# Patient Record
Sex: Male | Born: 1980 | Race: Black or African American | Hispanic: No | Marital: Married | Smoking: Former smoker
Health system: Southern US, Community
[De-identification: ages and names within clinical notes are randomized; demographics above are authoritative.]

## PROBLEM LIST (undated history)

## (undated) DIAGNOSIS — I10 Essential (primary) hypertension: Secondary | ICD-10-CM

## (undated) DIAGNOSIS — E119 Type 2 diabetes mellitus without complications: Secondary | ICD-10-CM

## (undated) DIAGNOSIS — R222 Localized swelling, mass and lump, trunk: Secondary | ICD-10-CM

## (undated) DIAGNOSIS — G4733 Obstructive sleep apnea (adult) (pediatric): Secondary | ICD-10-CM

## (undated) HISTORY — PX: GANGLION CYST EXCISION: SHX1691

## (undated) HISTORY — DX: Obstructive sleep apnea (adult) (pediatric): G47.33

---
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
## 2003-08-24 ENCOUNTER — Emergency Department (HOSPITAL_COMMUNITY): Admission: EM | Admit: 2003-08-24 | Discharge: 2003-08-24 | Payer: Self-pay | Admitting: Emergency Medicine

## 2005-07-14 ENCOUNTER — Emergency Department (HOSPITAL_COMMUNITY): Admission: EM | Admit: 2005-07-14 | Discharge: 2005-07-14 | Payer: Self-pay | Admitting: *Deleted

## 2005-07-15 ENCOUNTER — Emergency Department (HOSPITAL_COMMUNITY): Admission: EM | Admit: 2005-07-15 | Discharge: 2005-07-15 | Payer: Self-pay | Admitting: Emergency Medicine

## 2005-07-25 ENCOUNTER — Emergency Department (HOSPITAL_COMMUNITY): Admission: EM | Admit: 2005-07-25 | Discharge: 2005-07-25 | Payer: Self-pay | Admitting: Emergency Medicine

## 2007-12-23 ENCOUNTER — Emergency Department (HOSPITAL_COMMUNITY): Admission: EM | Admit: 2007-12-23 | Discharge: 2007-12-23 | Payer: Self-pay | Admitting: Family Medicine

## 2007-12-28 ENCOUNTER — Emergency Department (HOSPITAL_COMMUNITY): Admission: EM | Admit: 2007-12-28 | Discharge: 2007-12-28 | Payer: Self-pay | Admitting: Emergency Medicine

## 2011-04-06 LAB — URINALYSIS, ROUTINE W REFLEX MICROSCOPIC
Glucose, UA: NEGATIVE
Hgb urine dipstick: NEGATIVE
Ketones, ur: 40 — AB
Nitrite: POSITIVE — AB
Protein, ur: 30 — AB
Specific Gravity, Urine: 1.035 — ABNORMAL HIGH
Urobilinogen, UA: >8 — ABNORMAL HIGH
pH: 5

## 2011-04-06 LAB — URINE CULTURE: Colony Count: NO GROWTH

## 2011-04-06 LAB — POCT RAPID STREP A: Streptococcus, Group A Screen (Direct): NEGATIVE

## 2011-04-06 LAB — URINE MICROSCOPIC-ADD ON

## 2011-05-30 ENCOUNTER — Encounter: Payer: Self-pay | Admitting: Emergency Medicine

## 2011-05-30 ENCOUNTER — Emergency Department (HOSPITAL_COMMUNITY)
Admission: EM | Admit: 2011-05-30 | Discharge: 2011-05-31 | Disposition: A | Discharge status: Home / Self Care | Payer: Self-pay | Attending: Emergency Medicine | Admitting: Emergency Medicine

## 2011-05-30 ENCOUNTER — Emergency Department (HOSPITAL_COMMUNITY): Payer: Self-pay

## 2011-05-30 DIAGNOSIS — F17200 Nicotine dependence, unspecified, uncomplicated: Secondary | ICD-10-CM | POA: Insufficient documentation

## 2011-05-30 DIAGNOSIS — J189 Pneumonia, unspecified organism: Secondary | ICD-10-CM | POA: Insufficient documentation

## 2011-05-30 DIAGNOSIS — R51 Headache: Secondary | ICD-10-CM | POA: Insufficient documentation

## 2011-05-30 DIAGNOSIS — R079 Chest pain, unspecified: Secondary | ICD-10-CM | POA: Insufficient documentation

## 2011-05-30 MED ORDER — IPRATROPIUM BROMIDE 0.02 % IN SOLN
0.5000 mg | Freq: Once | RESPIRATORY_TRACT | Status: AC
  Administered 2011-05-30: 0.5 mg via RESPIRATORY_TRACT
  Filled 2011-05-30: qty 2.5

## 2011-05-30 MED ORDER — ALBUTEROL SULFATE (5 MG/ML) 0.5% IN NEBU
5.0000 mg | INHALATION_SOLUTION | Freq: Once | RESPIRATORY_TRACT | Status: AC
  Administered 2011-05-30: 5 mg via RESPIRATORY_TRACT
  Filled 2011-05-30: qty 1

## 2011-05-30 MED ORDER — AZITHROMYCIN 250 MG PO TABS: 250.0000 mg | ORAL_TABLET | Freq: Every day | ORAL | Status: AC

## 2011-05-30 MED ORDER — ALBUTEROL SULFATE HFA 108 (90 BASE) MCG/ACT IN AERS: 1.0000 | INHALATION_SPRAY | Freq: Four times a day (QID) | RESPIRATORY_TRACT | Status: DC | PRN

## 2011-05-30 MED ORDER — AZITHROMYCIN 250 MG PO TABS
500.0000 mg | ORAL_TABLET | Freq: Once | ORAL | Status: AC
  Administered 2011-05-31: 500 mg via ORAL
  Filled 2011-05-30: qty 2

## 2011-05-30 NOTE — ED Provider Notes (Signed)
History     CSN: [id number] Arrival date & time: 05/30/2011  8:44 PM   First MD Initiated Contact with Patient 05/30/11 2310      Chief Complaint  Patient presents with  . Cough  . Headache    (Consider location/radiation/quality/duration/timing/severity/associated sxs/prior treatment) Patient is a 30 y.o. male presenting with cough and headaches. The history is provided by the patient.  Cough This is a new problem. The current episode started more than 1 week ago. The problem has not changed since onset.There has been no fever. Associated symptoms include chest pain, headaches and wheezing. Pertinent negatives include no rhinorrhea, no sore throat and no shortness of breath. He has tried nothing for the symptoms.  Headache  Pertinent negatives include no shortness of breath.   Pt has been having cough and headache since last week.  Bringing up mucus.  Some dry heaves with coughing spells.  Pt is also having some pain in his chest with the cough. History reviewed. No pertinent past medical history.  History reviewed. No pertinent past surgical history.  History reviewed. No pertinent family history.  History  Substance Use Topics  . Smoking status: Current Everyday Smoker -- 0.5 packs/day  . Smokeless tobacco: Not on file  . Alcohol Use: Yes      Review of Systems  HENT: Negative for sore throat and rhinorrhea.   Respiratory: Positive for cough and wheezing. Negative for shortness of breath.   Cardiovascular: Positive for chest pain.  Neurological: Positive for headaches.  All other systems reviewed and are negative.    Allergies  Review of patient's allergies indicates no known allergies.  Home Medications  No current outpatient prescriptions on file.  BP 139/71  Pulse 116  Temp(Src) 99.7 F (37.6 C) (Oral)  Resp 20  SpO2 94%  Physical Exam  Nursing note and vitals reviewed. Constitutional: He appears well-developed and well-nourished. No distress.    HENT:  Head: Normocephalic and atraumatic.  Right Ear: External ear normal.  Left Ear: External ear normal.  Eyes: Conjunctivae are normal. Right eye exhibits no discharge. Left eye exhibits no discharge. No scleral icterus.  Neck: Neck supple. No tracheal deviation present.  Cardiovascular: Normal rate, regular rhythm and intact distal pulses.   Pulmonary/Chest: Effort normal. No accessory muscle usage or stridor. Not tachypneic. No respiratory distress. He has no wheezes. He has rales in the right upper field and the right lower field.  Abdominal: Soft. Bowel sounds are normal. He exhibits no distension. There is no tenderness. There is no rebound and no guarding.  Musculoskeletal: He exhibits no edema and no tenderness.  Neurological: He is alert. He has normal strength. No sensory deficit. Cranial nerve deficit:  no gross defecits noted. He exhibits normal muscle tone. He displays no seizure activity. Coordination normal.  Skin: Skin is warm and dry. No rash noted.  Psychiatric: He has a normal mood and affect.    ED Course  Procedures (including critical care time)  Labs Reviewed - No data to display Dg Chest 2 View  05/30/2011  *RADIOLOGY REPORT*  Clinical Data: Chest pain.  Cough.  Shortness of breath.  Rales on the right at physical examination.  CHEST - 2 VIEW 05/30/2011:  Comparison: None.  Findings: Cardiomediastinal silhouette unremarkable.  Patchy airspace opacities in the medial right lower lobe.  Lungs otherwise clear.  No pleural effusions.  Visualized bony thorax intact.  IMPRESSION: Right lower lobe bronchopneumonia.  Original Report Authenticated By: Arnell Sieving, M.D.  Diagnosis: Community-acquired pneumonia   MDM  Patient appears to have a right lower lobe pneumonia. He is otherwise healthy. He has normal vital signs except for mild tachycardia.  Patient has been taking oral fluids well. He will be a good candidate for outpatient  treatment.        Celene Kras, MD 05/30/11 [phone]

## 2011-05-30 NOTE — ED Notes (Signed)
Patient transported to X-ray 

## 2011-05-30 NOTE — ED Notes (Signed)
Pt states he began having a cough and headache that started around Wednesday of last week. Having trouble sleeping due to the pain/coughing.  Dry heaves from coughing spells.  Mucus in cough.

## 2011-05-30 NOTE — ED Notes (Signed)
Pt to Ed with cough/cold symptoms since Wed that this is not getting any better. Pt states that he has been taking Robitussin and Mucinex but is still not getting any relief. Pt also with c/o h/a

## 2011-05-31 NOTE — ED Notes (Signed)
Pt given discharge instructions and verbalizes understanding  

## 2012-08-13 ENCOUNTER — Emergency Department (HOSPITAL_COMMUNITY)
Admission: EM | Admit: 2012-08-13 | Discharge: 2012-08-13 | Disposition: A | Discharge status: Home / Self Care | Payer: Self-pay | Attending: Emergency Medicine | Admitting: Emergency Medicine

## 2012-08-13 ENCOUNTER — Encounter (HOSPITAL_COMMUNITY): Payer: Self-pay | Admitting: Emergency Medicine

## 2012-08-13 DIAGNOSIS — Z79899 Other long term (current) drug therapy: Secondary | ICD-10-CM | POA: Insufficient documentation

## 2012-08-13 DIAGNOSIS — R300 Dysuria: Secondary | ICD-10-CM | POA: Insufficient documentation

## 2012-08-13 DIAGNOSIS — F17200 Nicotine dependence, unspecified, uncomplicated: Secondary | ICD-10-CM | POA: Insufficient documentation

## 2012-08-13 LAB — URINALYSIS, ROUTINE W REFLEX MICROSCOPIC
Bilirubin Urine: NEGATIVE
Ketones, ur: NEGATIVE mg/dL
Leukocytes, UA: NEGATIVE
Nitrite: NEGATIVE
Urobilinogen, UA: 0.2 mg/dL (ref 0.0–1.0)

## 2012-08-13 NOTE — ED Provider Notes (Signed)
History     CSN: [id number]  Arrival date & time 08/13/12  0133   First MD Initiated Contact with Patient 08/13/12 0252      Chief Complaint  Patient presents with  . Urinary Frequency   HPI  History provided by the patient. Patient is a 32 year old male with no significant PMH who presents with complaints of dysuria and pressure for the past 2 days. Patient also reports some slight increased urinary frequency. He denies having similar symptoms previously. Patient is married with only one sexual partner. He has no past history of STDs. He has no penile discharge. Discomfort occasionally radiates into testicle area. Pain and symptoms are not constant. There is no hematuria or foul odor. Denies any swelling of the scrotum or testicles. Denies any rash to the skin. Denies any significant abdominal pain or flank pain. No fever, chills or sweats. Patient has not used any treatments for symptoms. Denies any other aggravating or alleviating factors. No other associated symptoms.     History reviewed. No pertinent past medical history.  Past Surgical History  Procedure Date  . Cyst removed from wrist     Family History  Problem Relation Age of Onset  . Hypertension Other   . Diabetes Other     History  Substance Use Topics  . Smoking status: Current Every Day Smoker -- 0.5 packs/day    Types: Cigarettes  . Smokeless tobacco: Not on file  . Alcohol Use: Yes     Comment: social      Review of Systems  Constitutional: Negative for fever and chills.  Gastrointestinal: Negative for nausea, vomiting, abdominal pain and diarrhea.  Genitourinary: Positive for dysuria, frequency and testicular pain. Negative for hematuria, flank pain, discharge, scrotal swelling and penile pain.  All other systems reviewed and are negative.    Allergies  Review of patient's allergies indicates no known allergies.  Home Medications   Current Outpatient Rx  Name  Route  Sig  Dispense  Refill  .  ALBUTEROL SULFATE HFA 108 (90 BASE) MCG/ACT IN AERS   Inhalation   Inhale 1-2 puffs into the lungs every 6 (six) hours as needed for wheezing.   1 Inhaler   0     BP 139/87  Pulse 89  Temp 98.1 F (36.7 C) (Oral)  Resp 14  SpO2 100%  Physical Exam  Nursing note and vitals reviewed. Constitutional: He is oriented to person, place, and time. He appears well-developed and well-nourished. No distress.  HENT:  Head: Normocephalic.  Eyes: Conjunctivae normal are normal.  Cardiovascular: Normal rate and regular rhythm.   No murmur heard. Pulmonary/Chest: Effort normal and breath sounds normal. No respiratory distress. He has no wheezes. He has no rales.  Abdominal: Soft. There is no tenderness. There is no rebound and no guarding. Hernia confirmed negative in the right inguinal area and confirmed negative in the left inguinal area.  Genitourinary: Testes normal and penis normal. Right testis shows no mass, no swelling and no tenderness. Left testis shows no mass, no swelling and no tenderness.  Lymphadenopathy:       Right: No inguinal adenopathy present.       Left: No inguinal adenopathy present.  Neurological: He is alert and oriented to person, place, and time.  Psychiatric: He has a normal mood and affect. His behavior is normal.    ED Course  Procedures    Results for orders placed during the hospital encounter of 08/13/12  URINALYSIS, ROUTINE W REFLEX MICROSCOPIC  Component Value Range   Color, Urine YELLOW  YELLOW   APPearance CLEAR  CLEAR   Specific Gravity, Urine 1.029  1.005 - 1.030   pH 6.0  5.0 - 8.0   Glucose, UA NEGATIVE  NEGATIVE mg/dL   Hgb urine dipstick NEGATIVE  NEGATIVE   Bilirubin Urine NEGATIVE  NEGATIVE   Ketones, ur NEGATIVE  NEGATIVE mg/dL   Protein, ur NEGATIVE  NEGATIVE mg/dL   Urobilinogen, UA 0.2  0.0 - 1.0 mg/dL   Nitrite NEGATIVE  NEGATIVE   Leukocytes, UA NEGATIVE  NEGATIVE       1. Dysuria       MDM  Pt seen and evaluated.  Patient well-appearing in no acute distress. He does not appear ill or toxic.  Urinalysis unremarkable. No other concerning findings on examination to explain patient's symptoms. At this time he is instructed to continue to monitor symptoms and followup with a primary care provider.        Angus Seller, [place] 08/13/12 2037

## 2012-08-13 NOTE — ED Notes (Signed)
Pt states for the past two days he has been having urinary frequency and even after voiding he feels like he still needs to go  Pt states he has been having discomfort in his lower abd, back, and scrotal area

## 2012-08-14 NOTE — ED Provider Notes (Signed)
Medical screening examination/treatment/procedure(s) were performed by non-physician practitioner and as supervising physician I was immediately available for consultation/collaboration.  Jones Skene, M.D.     Jones Skene, MD 08/14/12 1914

## 2013-08-14 ENCOUNTER — Encounter (HOSPITAL_COMMUNITY): Payer: Self-pay | Admitting: Emergency Medicine

## 2013-08-14 ENCOUNTER — Emergency Department (HOSPITAL_COMMUNITY): Payer: Self-pay

## 2013-08-14 ENCOUNTER — Emergency Department (HOSPITAL_COMMUNITY)
Admission: EM | Admit: 2013-08-14 | Discharge: 2013-08-14 | Disposition: A | Discharge status: Home / Self Care | Payer: Self-pay | Attending: Emergency Medicine | Admitting: Emergency Medicine

## 2013-08-14 DIAGNOSIS — R079 Chest pain, unspecified: Secondary | ICD-10-CM | POA: Insufficient documentation

## 2013-08-14 DIAGNOSIS — R0609 Other forms of dyspnea: Secondary | ICD-10-CM | POA: Insufficient documentation

## 2013-08-14 DIAGNOSIS — F17200 Nicotine dependence, unspecified, uncomplicated: Secondary | ICD-10-CM | POA: Insufficient documentation

## 2013-08-14 DIAGNOSIS — R0600 Dyspnea, unspecified: Secondary | ICD-10-CM

## 2013-08-14 DIAGNOSIS — R0989 Other specified symptoms and signs involving the circulatory and respiratory systems: Principal | ICD-10-CM | POA: Insufficient documentation

## 2013-08-14 LAB — CBC WITH DIFFERENTIAL/PLATELET
Basophils Absolute: 0 10*3/uL (ref 0.0–0.1)
Basophils Relative: 0 % (ref 0–1)
EOS PCT: 2 % (ref 0–5)
Eosinophils Absolute: 0.1 10*3/uL (ref 0.0–0.7)
HEMATOCRIT: 43.4 % (ref 39.0–52.0)
HEMOGLOBIN: 15.8 g/dL (ref 13.0–17.0)
LYMPHS ABS: 3 10*3/uL (ref 0.7–4.0)
LYMPHS PCT: 41 % (ref 12–46)
MCH: 31 pg (ref 26.0–34.0)
MCHC: 36.4 g/dL — ABNORMAL HIGH (ref 30.0–36.0)
MCV: 85.1 fL (ref 78.0–100.0)
MONO ABS: 0.6 10*3/uL (ref 0.1–1.0)
MONOS PCT: 8 % (ref 3–12)
NEUTROS ABS: 3.6 10*3/uL (ref 1.7–7.7)
Neutrophils Relative %: 49 % (ref 43–77)
Platelets: 249 10*3/uL (ref 150–400)
RBC: 5.1 MIL/uL (ref 4.22–5.81)
RDW: 13.3 % (ref 11.5–15.5)
WBC: 7.3 10*3/uL (ref 4.0–10.5)

## 2013-08-14 LAB — POCT I-STAT TROPONIN I
TROPONIN I, POC: 0 ng/mL (ref 0.00–0.08)
Troponin i, poc: 0 ng/mL (ref 0.00–0.08)

## 2013-08-14 LAB — BASIC METABOLIC PANEL
BUN: 18 mg/dL (ref 6–23)
CALCIUM: 9.1 mg/dL (ref 8.4–10.5)
CHLORIDE: 100 meq/L (ref 96–112)
CO2: 26 meq/L (ref 19–32)
CREATININE: 1.01 mg/dL (ref 0.50–1.35)
GFR calc Af Amer: >90 mL/min (ref >90)
GFR calc non Af Amer: >90 mL/min (ref >90)
GLUCOSE: 101 mg/dL — AB (ref 70–99)
Potassium: 3.8 mEq/L (ref 3.7–5.3)
Sodium: 137 mEq/L (ref 137–147)

## 2013-08-14 LAB — PRO B NATRIURETIC PEPTIDE: Pro B Natriuretic peptide (BNP): <5 pg/mL (ref 0–125)

## 2013-08-14 MED ORDER — ASPIRIN EC 325 MG PO TBEC: 325.0000 mg | DELAYED_RELEASE_TABLET | Freq: Every day | ORAL | Status: DC

## 2013-08-14 MED ORDER — ASPIRIN 81 MG PO CHEW
324.0000 mg | CHEWABLE_TABLET | Freq: Once | ORAL | Status: AC
  Administered 2013-08-14: 324 mg via ORAL
  Filled 2013-08-14: qty 4

## 2013-08-14 NOTE — ED Notes (Signed)
Patient taken to xray via stretcher Patient in NAD upon leaving room

## 2013-08-14 NOTE — Discharge Instructions (Signed)
Cardiology team will call you to order a stress test. Please return to the ER if the symptoms if the symptoms get worse.   Chest Pain (Nonspecific) It is often hard to give a specific diagnosis for the cause of chest pain. There is always a chance that your pain could be related to something serious, such as a heart attack or a blood clot in the lungs. You need to follow up with your caregiver for further evaluation. CAUSES   Heartburn.  Pneumonia or bronchitis.  Anxiety or stress.  Inflammation around your heart (pericarditis) or lung (pleuritis or pleurisy).  A blood clot in the lung.  A collapsed lung (pneumothorax). It can develop suddenly on its own (spontaneous pneumothorax) or from injury (trauma) to the chest.  Shingles infection (herpes zoster virus). The chest wall is composed of bones, muscles, and cartilage. Any of these can be the source of the pain.  The bones can be bruised by injury.  The muscles or cartilage can be strained by coughing or overwork.  The cartilage can be affected by inflammation and become sore (costochondritis). DIAGNOSIS  Lab tests or other studies, such as X-rays, electrocardiography, stress testing, or cardiac imaging, may be needed to find the cause of your pain.  TREATMENT   Treatment depends on what may be causing your chest pain. Treatment may include:  Acid blockers for heartburn.  Anti-inflammatory medicine.  Pain medicine for inflammatory conditions.  Antibiotics if an infection is present.  You may be advised to change lifestyle habits. This includes stopping smoking and avoiding alcohol, caffeine, and chocolate.  You may be advised to keep your head raised (elevated) when sleeping. This reduces the chance of acid going backward from your stomach into your esophagus.  Most of the time, nonspecific chest pain will improve within 2 to 3 days with rest and mild pain medicine. HOME CARE INSTRUCTIONS   If antibiotics were  prescribed, take your antibiotics as directed. Finish them even if you start to feel better.  For the next few days, avoid physical activities that bring on chest pain. Continue physical activities as directed.  Do not smoke.  Avoid drinking alcohol.  Only take over-the-counter or prescription medicine for pain, discomfort, or fever as directed by your caregiver.  Follow your caregiver's suggestions for further testing if your chest pain does not go away.  Keep any follow-up appointments you made. If you do not go to an appointment, you could develop lasting (chronic) problems with pain. If there is any problem keeping an appointment, you must call to reschedule. SEEK MEDICAL CARE IF:   You think you are having problems from the medicine you are taking. Read your medicine instructions carefully.  Your chest pain does not go away, even after treatment.  You develop a rash with blisters on your chest. SEEK IMMEDIATE MEDICAL CARE IF:   You have increased chest pain or pain that spreads to your arm, neck, jaw, back, or abdomen.  You develop shortness of breath, an increasing cough, or you are coughing up blood.  You have severe back or abdominal pain, feel nauseous, or vomit.  You develop severe weakness, fainting, or chills.  You have a fever. THIS IS AN EMERGENCY. Do not wait to see if the pain will go away. Get medical help at once. Call your local emergency services (911 in [place] Do not drive yourself to the hospital. MAKE SURE YOU:   Understand these instructions.  Will watch your condition.  Will get help  right away if you are not doing well or get worse. Document Released: 04/05/2005 Document Revised: 09/18/2011 Document Reviewed: 01/30/2008 [facility] Patient Information 2014 [place].

## 2013-08-14 NOTE — ED Notes (Signed)
Pt escorted to discharge window. Pt verbalized understanding discharge instructions. In no acute distress.  

## 2013-08-14 NOTE — ED Notes (Signed)
Pt complains of not being able to catch his breath since yesterday morning

## 2013-08-14 NOTE — ED Provider Notes (Signed)
CSN: [id number]     Arrival date & time 08/14/13  0421 History   First MD Initiated Contact with Patient 08/14/13 [phone]     Chief Complaint  Patient presents with  . Shortness of Breath   (Consider location/radiation/quality/duration/timing/severity/associated sxs/prior Treatment) HPI Comments: Pt comes in with cc of dib. Pt has no medical hx, + smoking hx. Reports some dib - starting yday, with some pressure like sensation to the chest. Pt's chest pain is intermittent. His dyspnea is worse with exertion - but not the case with chest pain. No CAD hx, no substance abuse, + fam hx of CAD - but not in young people.   Patient is a 33 y.o. male presenting with shortness of breath. The history is provided by the patient.  Shortness of Breath Associated symptoms: chest pain   Associated symptoms: no abdominal pain and no cough     History reviewed. No pertinent past medical history. Past Surgical History  Procedure Laterality Date  . Cyst removed from wrist     Family History  Problem Relation Age of Onset  . Hypertension Other   . Diabetes Other    History  Substance Use Topics  . Smoking status: Current Every Day Smoker -- 0.50 packs/day    Types: Cigarettes  . Smokeless tobacco: Not on file  . Alcohol Use: Yes     Comment: social    Review of Systems  Constitutional: Negative for activity change and appetite change.  Respiratory: Positive for shortness of breath. Negative for cough.   Cardiovascular: Positive for chest pain.  Gastrointestinal: Negative for abdominal pain.  Genitourinary: Negative for dysuria.    Allergies  Review of patient's allergies indicates no known allergies.  Home Medications   Current Outpatient Rx  Name  Route  Sig  Dispense  Refill  . acetaminophen (TYLENOL) 500 MG tablet   Oral   Take 1,000 mg by mouth every 6 (six) hours as needed for moderate pain, fever or headache.          BP 137/86  Pulse 84  Temp(Src) 98.2 F (36.8 C) (Oral)   Resp 16  SpO2 97% Physical Exam  Nursing note and vitals reviewed. Constitutional: He is oriented to person, place, and time. He appears well-developed.  HENT:  Head: Normocephalic and atraumatic.  Eyes: Conjunctivae and EOM are normal. Pupils are equal, round, and reactive to light.  Neck: Normal range of motion. Neck supple.  Cardiovascular: Normal rate and regular rhythm.   Pulmonary/Chest: Effort normal and breath sounds normal.  Abdominal: Soft. Bowel sounds are normal. He exhibits no distension. There is no tenderness. There is no rebound and no guarding.  Neurological: He is alert and oriented to person, place, and time.  Skin: Skin is warm.    ED Course  Procedures (including critical care time) Labs Review Labs Reviewed  CBC WITH DIFFERENTIAL - Abnormal; Notable for the following:    MCHC 36.4 (*)    All other components within normal limits  BASIC METABOLIC PANEL - Abnormal; Notable for the following:    Glucose, Bld 101 (*)    All other components within normal limits  PRO B NATRIURETIC PEPTIDE  POCT I-STAT TROPONIN I   Imaging Review Dg Chest 2 View  08/14/2013   CLINICAL DATA:  Chest pain.  EXAM: CHEST  2 VIEW  COMPARISON:  05/30/2011  FINDINGS: Normal heart size and mediastinal contours. No acute infiltrate or edema. No effusion or pneumothorax. No acute osseous findings.  IMPRESSION: No  active cardiopulmonary disease.   Electronically Signed   By: Jorje Guild M.D.   On: 08/14/2013 05:48    EKG Interpretation    Date/Time:  Thursday August 14 2013 04:28:46 EST Ventricular Rate:  95 PR Interval:  138 QRS Duration: 81 QT Interval:  323 QTC Calculation: 406 R Axis:   44 Text Interpretation:  Sinus rhythm Borderline T wave abnormalities Confirmed by Lequan Dobratz, MD, Davan Nawabi ([id number]) on 08/14/2013 5:29:16 AM            MDM  No diagnosis found.   Date: 08/14/2013  Rate: 80  Rhythm: normal sinus rhythm  QRS Axis: normal  Intervals: normal  ST/T Wave  abnormalities: normal  Conduction Disutrbances: none  Narrative Interpretation: unremarkable  Pt comes in with dyspnea and some chest tightness. Pt's HEART score is 2 - for risk factor (smoker, obese, OSA) and moderately suspicious story.  I spoke with Dr. Mare Ferrari - and he will try to schedule an outpatient, treadmill stress. PERC neg, WELLS score 0 - so unlikely PE.        Varney Biles, MD 08/14/13 0800

## 2013-08-14 NOTE — ED Notes (Signed)
Attempted lab draw x 2 but unsuccessful. RN, Raquel Sarna made aware.

## 2013-08-14 NOTE — ED Notes (Signed)
Pt alert and oriented x4. Respirations even and unlabored, bilateral symmetrical rise and fall of chest. Skin warm and dry. In no acute distress. Denies needs.   

## 2013-09-22 ENCOUNTER — Encounter: Payer: Self-pay | Admitting: *Deleted

## 2013-09-23 ENCOUNTER — Encounter: Payer: Self-pay | Admitting: Physician Assistant

## 2013-10-03 ENCOUNTER — Encounter: Payer: Self-pay | Admitting: Physician Assistant

## 2014-12-05 ENCOUNTER — Emergency Department (HOSPITAL_COMMUNITY)
Admission: EM | Admit: 2014-12-05 | Discharge: 2014-12-05 | Disposition: A | Discharge status: Home / Self Care | Payer: Self-pay | Attending: Emergency Medicine | Admitting: Emergency Medicine

## 2014-12-05 ENCOUNTER — Encounter (HOSPITAL_COMMUNITY): Payer: Self-pay | Admitting: Emergency Medicine

## 2014-12-05 DIAGNOSIS — X58XXXA Exposure to other specified factors, initial encounter: Secondary | ICD-10-CM | POA: Insufficient documentation

## 2014-12-05 DIAGNOSIS — S0501XA Injury of conjunctiva and corneal abrasion without foreign body, right eye, initial encounter: Secondary | ICD-10-CM | POA: Insufficient documentation

## 2014-12-05 DIAGNOSIS — Z720 Tobacco use: Secondary | ICD-10-CM | POA: Insufficient documentation

## 2014-12-05 DIAGNOSIS — Y939 Activity, unspecified: Secondary | ICD-10-CM | POA: Insufficient documentation

## 2014-12-05 DIAGNOSIS — Y999 Unspecified external cause status: Secondary | ICD-10-CM | POA: Insufficient documentation

## 2014-12-05 DIAGNOSIS — Z7982 Long term (current) use of aspirin: Secondary | ICD-10-CM | POA: Insufficient documentation

## 2014-12-05 DIAGNOSIS — Y929 Unspecified place or not applicable: Secondary | ICD-10-CM | POA: Insufficient documentation

## 2014-12-05 MED ORDER — ERYTHROMYCIN 5 MG/GM OP OINT
TOPICAL_OINTMENT | Freq: Once | OPHTHALMIC | Status: AC
  Administered 2014-12-05: 23:00:00 via OPHTHALMIC
  Filled 2014-12-05: qty 3.5

## 2014-12-05 MED ORDER — FLUORESCEIN SODIUM 1 MG OP STRP
1.0000 | ORAL_STRIP | Freq: Once | OPHTHALMIC | Status: DC
  Filled 2014-12-05: qty 1

## 2014-12-05 MED ORDER — TETRACAINE HCL 0.5 % OP SOLN
1.0000 [drp] | Freq: Once | OPHTHALMIC | Status: DC
  Filled 2014-12-05: qty 2

## 2014-12-05 NOTE — ED Notes (Signed)
Pt states he woke up this morning and had redness, and light sensitivity to R eye. Pt denies itching and drainage from R eye. 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 Denies visual disturbances.

## 2014-12-05 NOTE — ED Provider Notes (Signed)
CSN: [id number]     Arrival date & time 12/05/14  2123 History  This chart was scribed for Junius Creamer, NP, working with Dorie Rank, MD by Girtha Hake, ED Scribe. The patient was seen in [facility]. The patient's care was started at 9:57 PM.     Chief Complaint  Patient presents with  . Eye Pain   Patient is a 34 y.o. male presenting with eye pain. The history is provided by the patient. No language interpreter was used.  Eye Pain This is a new problem. The current episode started 12 to 24 hours ago. The problem occurs constantly. The problem has been gradually worsening. Pertinent negatives include no headaches. Nothing aggravates the symptoms. Nothing relieves the symptoms. He has tried a cold compress for the symptoms. The treatment provided no relief.   HPI Comments: Austin Mcmahon is a 34 y.o. Male who presents to the Emergency Department complaining of redness in his right eye beginning when he woke up this morning. Patient reports associated photosensitivity. He has a history of corneal ulcer and reports that this feel similar. Patient denies itching or visual disturbance.  Past Medical History  Diagnosis Date  . OSA (obstructive sleep apnea)    Past Surgical History  Procedure Laterality Date  . Ganglion cyst excision      from wrist   Family History  Problem Relation Age of Onset  . Hypertension Other   . Diabetes Other   . Cancer     History  Substance Use Topics  . Smoking status: Current Every Day Smoker -- 0.50 packs/day    Types: Cigarettes  . Smokeless tobacco: Not on file  . Alcohol Use: Yes     Comment: social    Review of Systems  Eyes: Positive for photophobia, pain and redness. Negative for visual disturbance.  Neurological: Negative for dizziness and headaches.      Allergies  Review of patient's allergies indicates no known allergies.  Home Medications   Prior to Admission medications   Medication Sig Start Date End Date Taking?  Authorizing Provider  acetaminophen (TYLENOL) 500 MG tablet Take 1,000 mg by mouth every 6 (six) hours as needed for moderate pain, fever or headache.    Historical Provider, MD  aspirin EC 325 MG tablet Take 1 tablet (325 mg total) by mouth daily. 08/14/13   Varney Biles, MD   Triage Vitals: BP 135/88 mmHg  Pulse 87  Temp(Src) 98.7 F (37.1 C) (Oral)  Resp 20  SpO2 99% Physical Exam  Constitutional: He appears well-developed and well-nourished.  HENT:  Head: Normocephalic.  Eyes: EOM are normal. Pupils are equal, round, and reactive to light. Right conjunctiva is injected.  Slit lamp exam:      The right eye shows fluorescein uptake. The right eye shows no anterior chamber bulge.    Neck: Normal range of motion.  Cardiovascular: Normal rate.   Pulmonary/Chest: Effort normal.  Nursing note and vitals reviewed.   ED Course  Procedures (including critical care time) DIAGNOSTIC STUDIES: Oxygen Saturation is 99% on room air, normal by my interpretation.    COORDINATION OF CARE:    Labs Review Labs Reviewed - No data to display  Imaging Review No results found.   EKG Interpretation None     patient has been started on erythromycin ointment 4 times a day.  He does have an ophthalmologist.  He will see on Tuesday.  She's been instructed to use the medication as directed until seen  MDM  Final diagnoses:  None    I personally performed the services described in this documentation, which was scribed in my presence. The recorded information has been reviewed and is accurate.    Junius Creamer, NP 12/05/14 [place], MD 12/09/14 [phone]

## 2014-12-05 NOTE — Discharge Instructions (Signed)
Corneal Abrasion The cornea is the clear covering at the front and center of the eye. When looking at the colored portion of the eye (iris), you are looking through the cornea. This very thin tissue is made up of many layers. The surface layer is a single layer of cells (corneal epithelium) and is one of the most sensitive tissues in the body. If a scratch or injury causes the corneal epithelium to come off, it is called a corneal abrasion. If the injury extends to the tissues below the epithelium, the condition is called a corneal ulcer. CAUSES   Scratches.  Trauma.  Foreign body in the eye. Some people have recurrences of abrasions in the area of the original injury even after it has healed (recurrent erosion syndrome). Recurrent erosion syndrome generally improves and goes away with time. SYMPTOMS   Eye pain.  Difficulty or inability to keep the injured eye open.  The eye becomes very sensitive to light.  Recurrent erosions tend to happen suddenly, first thing in the morning, usually after waking up and opening the eye. DIAGNOSIS  Your health care provider can diagnose a corneal abrasion during an eye exam. Dye is usually placed in the eye using a drop or a small paper strip moistened by your tears. When the eye is examined with a special light, the abrasion shows up clearly because of the dye. TREATMENT   Small abrasions may be treated with antibiotic drops or ointment alone.  A pressure patch may be put over the eye. If this is done, follow your doctor's instructions for when to remove the patch. Do not drive or use machines while the eye patch is on. Judging distances is hard to do with a patch on. If the abrasion becomes infected and spreads to the deeper tissues of the cornea, a corneal ulcer can result. This is serious because it can cause corneal scarring. Corneal scars interfere with light passing through the cornea and cause a loss of vision in the involved eye. HOME CARE  INSTRUCTIONS  Use medicine or ointment as directed. Only take over-the-counter or prescription medicines for pain, discomfort, or fever as directed by your health care provider.  Do not drive or operate machinery if your eye is patched. Your ability to judge distances is impaired.  If your health care provider has given you a follow-up appointment, it is very important to keep that appointment. Not keeping the appointment could result in a severe eye infection or permanent loss of vision. If there is any problem keeping the appointment, let your health care provider know. SEEK MEDICAL CARE IF:   You have pain, light sensitivity, and a scratchy feeling in one eye or both eyes.  Your pressure patch keeps loosening up, and you can blink your eye under the patch after treatment.  Any kind of discharge develops from the eye after treatment or if the lids stick together in the morning.  You have the same symptoms in the morning as you did with the original abrasion days, weeks, or months after the abrasion healed. MAKE SURE YOU:   Understand these instructions.  Will watch your condition.  Will get help right away if you are not doing well or get worse. Document Released: 06/23/2000 Document Revised: 07/01/2013 Document Reviewed: 03/03/2013 [facility] Patient Information 2015 [place], [place]. This information is not intended to replace advice given to you by your health care provider. Make sure you discuss any questions you have with your health care provider. You have been  given a tube of erythromycin ointment lesions use it as follow one quarter in stripe of medication.  Inside the lower right eyelid 4 times a day the next 5 days.  Please apply warm compress to the eye for 1-2 minutes prior to using the medication to increase its absorption.  Call your ophthalmologist tomorrow.  Leave a message on the machine, so that when you call for an appointment on Tuesday.  They are aware of your situation

## 2014-12-19 ENCOUNTER — Encounter (HOSPITAL_COMMUNITY): Payer: Self-pay | Admitting: Emergency Medicine

## 2014-12-19 ENCOUNTER — Emergency Department (HOSPITAL_COMMUNITY)
Admission: EM | Admit: 2014-12-19 | Discharge: 2014-12-19 | Disposition: A | Discharge status: Home / Self Care | Payer: Self-pay | Attending: Emergency Medicine | Admitting: Emergency Medicine

## 2014-12-19 DIAGNOSIS — Z720 Tobacco use: Secondary | ICD-10-CM | POA: Insufficient documentation

## 2014-12-19 DIAGNOSIS — Z8669 Personal history of other diseases of the nervous system and sense organs: Secondary | ICD-10-CM | POA: Insufficient documentation

## 2014-12-19 DIAGNOSIS — K088 Other specified disorders of teeth and supporting structures: Secondary | ICD-10-CM | POA: Insufficient documentation

## 2014-12-19 DIAGNOSIS — Z7982 Long term (current) use of aspirin: Secondary | ICD-10-CM | POA: Insufficient documentation

## 2014-12-19 DIAGNOSIS — K029 Dental caries, unspecified: Secondary | ICD-10-CM | POA: Insufficient documentation

## 2014-12-19 DIAGNOSIS — K0889 Other specified disorders of teeth and supporting structures: Secondary | ICD-10-CM

## 2014-12-19 MED ORDER — PENICILLIN V POTASSIUM 500 MG PO TABS
500.0000 mg | ORAL_TABLET | Freq: Four times a day (QID) | ORAL | Status: AC
Start: 2014-12-19 — End: 2014-12-26

## 2014-12-19 MED ORDER — TRAMADOL HCL 50 MG PO TABS: 50.0000 mg | ORAL_TABLET | Freq: Four times a day (QID) | ORAL | Status: DC | PRN

## 2014-12-19 MED ORDER — IBUPROFEN 600 MG PO TABS: 600.0000 mg | ORAL_TABLET | Freq: Four times a day (QID) | ORAL | Status: DC | PRN

## 2014-12-19 NOTE — ED Notes (Signed)
Pt has had 200 mg of tylenol po today

## 2014-12-19 NOTE — ED Provider Notes (Signed)
CSN: [id number]     Arrival date & time 12/19/14  2057 History   This chart was scribed for Antonietta Breach, PA-C working with Sherwood Gambler, MD by Mercy Moore, ED Scribe. This patient was seen in room WTR5/WTR5 and the patient's care was started at 9:31 PM.   Chief Complaint  Patient presents with  . Dental Pain   The history is provided by the patient. No language interpreter was used.   HPI Comments: Austin Mcmahon is a 34 y.o. male who presents to the Emergency Department complaining of right lower dental pain onset two nights ago.  Patient has scheduled a dentist appointment for Monday afternoon, in two days, but states that today the pain became intolerable and he has developed swelling. Patient reports treatment with Tylenol, without relief. Patient denies fever.   Past Medical History  Diagnosis Date  . OSA (obstructive sleep apnea)    Past Surgical History  Procedure Laterality Date  . Ganglion cyst excision      from wrist   Family History  Problem Relation Age of Onset  . Hypertension Other   . Diabetes Other   . Cancer     History  Substance Use Topics  . Smoking status: Current Every Day Smoker -- 0.50 packs/day    Types: Cigarettes  . Smokeless tobacco: Not on file  . Alcohol Use: Yes     Comment: social    Review of Systems  Constitutional: Negative for fever and chills.  HENT: Positive for dental problem and facial swelling.   All other systems reviewed and are negative.   Allergies  Review of patient's allergies indicates no known allergies.  Home Medications   Prior to Admission medications   Medication Sig Start Date End Date Taking? Authorizing Provider  aspirin EC 325 MG tablet Take 1 tablet (325 mg total) by mouth daily. 08/14/13   Varney Biles, MD  ibuprofen (ADVIL,MOTRIN) 600 MG tablet Take 1 tablet (600 mg total) by mouth every 6 (six) hours as needed. 12/19/14   Antonietta Breach, PA-C  naproxen sodium (ANAPROX) 220 MG tablet Take 440 mg by  mouth 2 (two) times daily as needed (for pain.).    Historical Provider, MD  penicillin v potassium (VEETID) 500 MG tablet Take 1 tablet (500 mg total) by mouth 4 (four) times daily. 12/19/14 12/26/14  Antonietta Breach, PA-C  traMADol (ULTRAM) 50 MG tablet Take 1 tablet (50 mg total) by mouth every 6 (six) hours as needed. 12/19/14   Antonietta Breach, PA-C   Triage Vitals: BP 123/80 mmHg  Pulse 100  Temp(Src) 99.1 F (37.3 C) (Oral)  Resp 16  Ht 5\' 7"  (1.702 m)  Wt 268 lb (121.564 kg)  BMI 41.96 kg/m2  SpO2 95%  Physical Exam  Constitutional: He is oriented to person, place, and time. He appears well-developed and well-nourished. No distress.  Nontoxic/nonseptic appearing  HENT:  Head: Normocephalic and atraumatic.  Mouth/Throat: Uvula is midline, oropharynx is clear and moist and mucous membranes are normal. No trismus in the jaw. Dental caries present. No uvula swelling.    Dental pain and mild gingival swelling. No fluctuance; possible early abscess. No active purulence or drainage. No trismus. Patient tolerating secretions without difficulty.  Eyes: Conjunctivae and EOM are normal. No scleral icterus.  Neck: Normal range of motion.  No nuchal rigidity or meningismus  Cardiovascular: Normal rate, regular rhythm and intact distal pulses.   Pulmonary/Chest: Effort normal. No respiratory distress.  Respirations even and unlabored  Musculoskeletal: Normal  range of motion.  Neurological: He is alert and oriented to person, place, and time. He exhibits normal muscle tone. Coordination normal.  Skin: Skin is warm and dry. No rash noted. He is not diaphoretic. No erythema. No pallor.  Psychiatric: He has a normal mood and affect. His behavior is normal.  Nursing note and vitals reviewed.   ED Course  Procedures (including critical care time)  COORDINATION OF CARE: 9:33 PM- Discussed treatment plan with patient at bedside and patient agreed to plan.   Labs Review Labs Reviewed - No data to  display  Imaging Review No results found.   EKG Interpretation None      MDM   Final diagnoses:  Dentalgia    Patient with toothache; possible early dental abscess. Exam unconcerning for Ludwig's angina or spread of infection. Will treat with penicillin and pain medicine. Urged patient to follow-up with dentist. He reports that he has an appointment in 2 days. Return precautions provided. Patient agreeable to plan with no unaddressed concerns.  I personally performed the services described in this documentation, which was scribed in my presence. The recorded information has been reviewed and is accurate.   Filed Vitals:   12/19/14 2104  BP: 123/80  Pulse: 100  Temp: 99.1 F (37.3 C)  TempSrc: Oral  Resp: 16  Height: 5\' 7"  (1.702 m)  Weight: 268 lb (121.564 kg)  SpO2: 95%      Antonietta Breach, PA-C 12/19/14 2140  Sherwood Gambler, MD 12/20/14 2157

## 2014-12-19 NOTE — ED Notes (Signed)
Pt from home reports having dental pain on the right lower side of his mouth that began on Thursday night. He got a dentist appointment for Monday afternoon, but today felt that the pain became "unbearable" that now radiates to the left side of his mouth as well. He reports that he feels a swelling inside his mouth on his gum and long his jaw. A slight area of swelling is noted, but mucous membranes are pink, moist, and intact.

## 2014-12-19 NOTE — Discharge Instructions (Signed)

## 2015-02-06 ENCOUNTER — Encounter (HOSPITAL_COMMUNITY): Payer: Self-pay | Admitting: Emergency Medicine

## 2015-02-06 ENCOUNTER — Emergency Department (HOSPITAL_COMMUNITY)
Admission: EM | Admit: 2015-02-06 | Discharge: 2015-02-06 | Disposition: A | Discharge status: Home / Self Care | Payer: Self-pay | Attending: Emergency Medicine | Admitting: Emergency Medicine

## 2015-02-06 DIAGNOSIS — K047 Periapical abscess without sinus: Secondary | ICD-10-CM | POA: Insufficient documentation

## 2015-02-06 DIAGNOSIS — Z720 Tobacco use: Secondary | ICD-10-CM | POA: Insufficient documentation

## 2015-02-06 DIAGNOSIS — Z8669 Personal history of other diseases of the nervous system and sense organs: Secondary | ICD-10-CM | POA: Insufficient documentation

## 2015-02-06 MED ORDER — LIDOCAINE HCL (PF) 1 % IJ SOLN
2.0000 mL | Freq: Once | INTRAMUSCULAR | Status: AC
  Administered 2015-02-06: 2 mL
  Filled 2015-02-06: qty 5

## 2015-02-06 MED ORDER — PENICILLIN V POTASSIUM 500 MG PO TABS: 500.0000 mg | ORAL_TABLET | Freq: Once | ORAL | Status: DC

## 2015-02-06 MED ORDER — PENICILLIN V POTASSIUM 500 MG PO TABS
500.0000 mg | ORAL_TABLET | Freq: Once | ORAL | Status: AC
  Administered 2015-02-06: 500 mg via ORAL
  Filled 2015-02-06: qty 1

## 2015-02-06 MED ORDER — TRAMADOL HCL 50 MG PO TABS: 50.0000 mg | ORAL_TABLET | Freq: Four times a day (QID) | ORAL | Status: DC | PRN

## 2015-02-06 MED ORDER — TRAMADOL HCL 50 MG PO TABS: 50.0000 mg | ORAL_TABLET | Freq: Once | ORAL | Status: DC

## 2015-02-06 NOTE — ED Notes (Signed)
States a few months ago came here for a similar problem, was prescribed penicillin and the abscess went away. States two days ago the abscess to right lower jaw has appeared again at base of gums.

## 2015-02-06 NOTE — ED Provider Notes (Addendum)
CSN: [id number]     Arrival date & time 02/06/15  2210 History   This chart was scribed for non-physician practitioner Junius Creamer, NP working with Blanchie Dessert, MD by Meriel Pica, ED Scribe. This patient was seen in room WTR7/WTR7 and the patient's care was started at 10:48 PM.   Chief Complaint  Patient presents with  . Dental Pain   HPI Comments: Pain R lower jaw  The history is provided by the patient.   HPI Comments: Austin Mcmahon is a 34 y.o. male who presents to the Emergency Department complaining of a recurrent, gradually worsening area of pain and swelling in lower right oral region onset 2 days ago.  Pt states he followed up with the dental referral after being evaluated in the ED over a month ago for the same complaint when he was discharged with penicillin which resolved his symptoms periodically. He states the dentist wanted to perform a root canal procedure and he could not afford this even with his insurance. Denies fevers.  Past Medical History  Diagnosis Date  . OSA (obstructive sleep apnea)    Past Surgical History  Procedure Laterality Date  . Ganglion cyst excision      from wrist   Family History  Problem Relation Age of Onset  . Hypertension Other   . Diabetes Other   . Cancer     History  Substance Use Topics  . Smoking status: Current Every Day Smoker -- 0.50 packs/day    Types: Cigarettes  . Smokeless tobacco: Not on file  . Alcohol Use: Yes     Comment: social    Review of Systems  Constitutional: Negative for fever.  HENT: Positive for dental problem.   Eyes: Negative.   Respiratory: Negative.   Gastrointestinal: Negative.   Endocrine: Negative.   Neurological: Negative.   Hematological: Negative.   Psychiatric/Behavioral: Negative.   All other systems reviewed and are negative.  Allergies  Review of patient's allergies indicates no known allergies.  Home Medications   Prior to Admission medications   Medication Sig  Start Date End Date Taking? Authorizing Provider  ibuprofen (ADVIL,MOTRIN) 200 MG tablet Take 400 mg by mouth every 6 (six) hours as needed for moderate pain.   Yes Historical Provider, MD  acetaminophen (TYLENOL) 500 MG tablet Take 1,000 mg by mouth every 6 (six) hours as needed for mild pain.    Historical Provider, MD  ibuprofen (ADVIL,MOTRIN) 600 MG tablet Take 1 tablet (600 mg total) by mouth every 6 (six) hours as needed. 12/19/14   Antonietta Breach, PA-C  penicillin v potassium (VEETID) 500 MG tablet Take 1 tablet (500 mg total) by mouth once. 02/06/15   Junius Creamer, NP  traMADol (ULTRAM) 50 MG tablet Take 1 tablet (50 mg total) by mouth every 6 (six) hours as needed. 02/06/15   Junius Creamer, NP   BP 147/91 mmHg  Pulse 93  Temp(Src) 99.4 F (37.4 C) (Oral)  Resp 18  SpO2 97% Physical Exam  Constitutional: He appears well-developed and well-nourished.  HENT:  Mouth/Throat:    Eyes: Pupils are equal, round, and reactive to light.  Lymphadenopathy:    He has no cervical adenopathy.  Nursing note and vitals reviewed.   ED Course  Procedures  DIAGNOSTIC STUDIES: Oxygen Saturation is 97% on RA, normal by my interpretation.    COORDINATION OF CARE: 10:35 PM Discussed treatment plan which includes to perform an I&D procedure with pt. Pt acknowledges and agrees to plan.  INCISION AND  DRAINAGE PROCEDURE NOTE: Patient identification was confirmed and verbal consent was obtained. This procedure was performed by Junius Creamer, NP at 10:43 PM. Site: gum Sterile procedures observed Needle size: 18 gauge  Anesthetic used (type and amt): lidocaine 1% without epinephrine  Blade size:  Drainage: large amount purulent  Complexity: Complex Packing usednone Site anesthetized, incision made over site, wound drainedg.  Pt tolerated procedure well without complications.  Instructions for care discussed verbally and pt provided with additional written instructions for homecare and f/u.   Labs  Review Labs Reviewed - No data to display  Imaging Review No results found.   EKG Interpretation None     Small incision made with an 18-gauge needle.  Copious amounts of purulent drainage noted.  Patient has been instructed to rinse his mouth with warm saline several times an hour for the next 1-2 hours to "suck and spit". Take the PCN as directed and make an appointment with a dentist for definative care  MDM   Final diagnoses:  Dental abscess   I personally performed the services described in this documentation, which was scribed in my presence. The recorded information has been reviewed and is accurate.   Junius Creamer, NP 02/06/15 [place]  Junius Creamer, NP 02/06/15 [place], MD 02/06/15 1017  Junius Creamer, NP 02/25/15 5102  Blanchie Dessert, MD 02/26/15 1503  Junius Creamer, NP 04/17/15 2007  Blanchie Dessert, MD 04/21/15 2035

## 2015-02-06 NOTE — Discharge Instructions (Signed)
Dental Abscess A dental abscess is a collection of infected fluid (pus) from a bacterial infection in the inner part of the tooth (pulp). It usually occurs at the end of the tooth's root.  CAUSES   Severe tooth decay.  Trauma to the tooth that allows bacteria to enter into the pulp, such as a broken or chipped tooth. SYMPTOMS   Severe pain in and around the infected tooth.  Swelling and redness around the abscessed tooth or in the mouth or face.  Tenderness.  Pus drainage.  Bad breath.  Bitter taste in the mouth.  Difficulty swallowing.  Difficulty opening the mouth.  Nausea.  Vomiting.  Chills.  Swollen neck glands. DIAGNOSIS   A medical and dental history will be taken.  An examination will be performed by tapping on the abscessed tooth.  X-rays may be taken of the tooth to identify the abscess. TREATMENT The goal of treatment is to eliminate the infection. You may be prescribed antibiotic medicine to stop the infection from spreading. A root canal may be performed to save the tooth. If the tooth cannot be saved, it may be pulled (extracted) and the abscess may be drained.  HOME CARE INSTRUCTIONS  Only take over-the-counter or prescription medicines for pain, fever, or discomfort as directed by your caregiver.  Rinse your mouth (gargle) often with salt water ( tsp salt in 8 oz [250 ml] of warm water) to relieve pain or swelling.  Do not drive after taking pain medicine (narcotics).  Do not apply heat to the outside of your face.  Return to your dentist for further treatment as directed. SEEK MEDICAL CARE IF:  Your pain is not helped by medicine.  Your pain is getting worse instead of better. SEEK IMMEDIATE MEDICAL CARE IF:  You have a fever or persistent symptoms for more than 2-3 days.  You have a fever and your symptoms suddenly get worse.  You have chills or a very bad headache.  You have problems breathing or swallowing.  You have trouble  opening your mouth.  You have swelling in the neck or around the eye. Document Released: 06/26/2005 Document Revised: 03/20/2012 Document Reviewed: 10/04/2010 [facility] Patient Information 2015 [place], [place]. This information is not intended to replace advice given to you by your health care provider. Make sure you discuss any questions you have with your health care provider.  Emergency Department Resource Guide 1) Find a Doctor and Pay Out of Pocket Although you won't have to find out who is covered by your insurance plan, it is a good idea to ask around and get recommendations. You will then need to call the office and see if the doctor you have chosen will accept you as a new patient and what types of options they offer for patients who are self-pay. Some doctors offer discounts or will set up payment plans for their patients who do not have insurance, but you will need to ask so you aren't surprised when you get to your appointment.  2) Contact Your Local Health Department Not all health departments have doctors that can see patients for sick visits, but many do, so it is worth a call to see if yours does. If you don't know where your local health department is, you can check in your phone book. The CDC also has a tool to help you locate your state's health department, and many state websites also have listings of all of their local health departments.  3) Find a [employer]  If your illness is not likely to be very severe or complicated, you may want to try a walk in clinic. These are popping up all over the country in pharmacies, drugstores, and shopping centers. They're usually staffed by nurse practitioners or physician assistants that have been trained to treat common illnesses and complaints. They're usually fairly quick and inexpensive. However, if you have serious medical issues or chronic medical problems, these are probably not your best option.  No Primary Care Doctor: - Call  Health Connect at  [phone] - they can help you locate a primary care doctor that  accepts your insurance, provides certain services, etc. - Physician Referral Service- [phone]  Chronic Pain Problems: Organization         Address  Phone   Notes  [facility]  [phone] Patients need to be referred by their primary care doctor.   Medication Assistance: Organization         Address  Phone   Notes  [facility] Medication [facility] [address] [place], [place] [address] [phone] --Must be a resident of [facility] -- Must have NO insurance coverage whatsoever (no Medicaid/ Medicare, etc.) -- The pt. MUST have a primary care doctor that directs their care regularly and follows them in the community   Med[facility]ist  [phone]   [employer]  [phone]    Agencies that provide inexpensive medical care: Organization         Address  Phone   Notes  [place]  [phone]   [facility]    [phone]   [facility], [place] [address] [phone]   [place] [address], [place] [phone]   Planned Parenthood    [phone]   [facility]    [phone]   [place] and [address], [place] Phone:  [phone], Fax:  [phone] Hours of Operation:  9 am - 6 pm, M-F.  Also accepts Medicaid/Medicare and self-pay.  [facility] for [address], [address], [place] Phone: [phone], Fax: [phone]. Hours of Operation:  8:30 am - 5:30 pm, M-F.  Also accepts Medicaid and self-pay.  [facility] [place] [address] [place] Phone: [phone]   [place], [place], [place] [phone], Ext. [phone] Mondays & Thursdays: 7-9 AM.  First 15 patients are seen on a first come, first serve  basis.    [place] Providers:  Organization         Address  Phone   Notes  [facility] [address],  [phone] Also accepts self-pay patients.  [facility] [address], [place]  [phone]   [place], [address], [place] [phone]   [facility] [address], [place] [phone]   Lucianne Lei [address], [place]   [phone] Only accepts [place] Access [place] patients after they have their name applied to their card.   Self-Pay (no insurance) in [facility]:  Organization         Address  Phone   Notes  Sickle Cell Patients, [facility] [place] [phone])  [facility] [place] [phone]   [facility] [place]  [place], [address], [place] [phone]   [facility]/Dr. Osei-Bonsu  [address], [place] or [address], [place] [phone] Phone number for both [place] and [place] locations is the same.  Urgent Medical and [facility] [address], [place] [phone]   [facility] [address], [place] or [address] [phone]   [facility] [address] [place] [phone], phone; [phone], fax Sees patients 1st and 3rd Saturday of every month.  Must not qualify for public or private insurance (i.e. Medicaid, Medicare, [place] Health Choice, Veterans' Benefits)  Household income should be no more than 200% of the poverty level The clinic cannot treat you if you are pregnant or think you are pregnant  Sexually transmitted diseases are not treated at the clinic.    Dental Care: Organization         Address  Phone  Notes  [facility] of [facility] [phone] Accepts children up to age 50 who are enrolled in [place] or [place]; pregnant women with a Medicaid card; and children who have applied for Medicaid or [place] Health Choice, but were declined, whose parents can pay a reduced fee at time of service.  [facility] Department of [facility]  [address], [place] [phone] Accepts children up to age 85 who are enrolled in [place] or [place]; pregnant women with a Medicaid card; and children who have applied for Medicaid or [place] Health Choice, but were declined, whose parents can pay a reduced fee at time of service.  [place] Adult Dental Access PROGRAM  [place] [phone] Patients are seen by appointment only. Walk-ins are not accepted. [place] will see patients 9 years of age and older. Monday - Tuesday (8am-5pm) Most Wednesdays (8:30-5pm) $30 per visit, cash only  [facility] Adult Dental Access PROGRAM  [address], [facility] [phone] Patients are seen by appointment only. Walk-ins are not accepted. [place] will see patients 28 years of age and older. One Wednesday Evening (Monthly: Volunteer Based).  $30 per visit, cash only  [place]  [phone] for adults; Children under age 71, call Graduate Pediatric Dentistry at [phone]. Children aged 6-14, please call [phone] to request a pediatric application.  Dental services are provided in all areas of dental care including fillings, crowns and bridges, complete and partial dentures, implants, gum treatment, root canals, and extractions. Preventive care is also provided. Treatment is provided to both adults and children. Patients are selected via a lottery and there is often a waiting list.   [facility] [address] [place]  [phone]  [email or web address]   [facility] [address] [place], [place] [phone], Ext. [phone] Second and Fourth Thursday of each month, opens at 6:30 AM; Clinic ends at 9 AM.  Patients are seen on a first-come first-served basis, and a limited number are seen during each clinic.   [facility]  [address] Hillard Danker [place], [place] [phone]   Eligibility Requirements You must have lived in [place], [place], or [place] counties for  at least the last three months.   You cannot be eligible for state or federal sponsored [employer], including [employer], [place], or [employer].   You generally cannot be eligible for healthcare insurance through your employer.    How to apply: Eligibility screenings are held every Tuesday and Wednesday afternoon from 1:00 pm until 4:00 pm. You do not need an appointment for the interview!  [facility] [address] [place], [place]   [place]  [place] Department  [place]  [phone]    Behavioral Health Resources in the Community: Intensive Outpatient Programs Organization         Address  Phone  Notes  [place]. [address] [place], [place] [phone]   [facility] Outpatient [address], [place], [place]   ADS: Alcohol & Drug Svcs [address], [place], [place]   [place] [address],  [place], [place] or [phone]   Substance Abuse Resources Organization         Address  Phone  Notes  Alcohol and Drug Services  [phone]   [place]  [phone]   [facility] [place]   Chinita Pester  [phone]   Residential & Outpatient Substance Abuse Program  [phone]   Psychological Services Organization          Address  Phone  Notes  [facility] [place]  [place]  [phone]   [place] [address] [place] or [phone]    Mobile Crisis Teams Organization         Address  Phone  Notes  [facility], Mobile Crisis Care Unit  [phone]   [facility]  [address] [place], [place]   [place] Levels [address], [place] [phone]    Self-Help/Support Groups Organization         Address  Phone             Notes  [place]. of [place] - variety of support groups  [place] Call for more information  Narcotics Anonymous (NA), [facility]aring [facility] [address], [employer] [place]  2 meetings at this location   Special educational needs teacher         Address  Phone  Notes  [facility]AP Residential Treatment [place],    [place]  [phone]   [facility]  [address] [place] [phone], [place], [place]   [place], [place] [phone] Admissions: 8am-3pm M-F  Incentives Substance [place] [address],    [place], [place] [phone]   [facility] [address] [place], [place], [place]   [facility] [address],  [place], [place]   [facility] - Intensive Outpatient [address] Kristeen [address], [place], [place]   [facility] [place].,  [place], [place] [phone] or [phone]   [facility]idential Treatment Services (RTS) [address], [place], [place] Accepts Medicaid  [facility] [place] [address]  [place] [phone] Substance Abuse/Addiction Treatment   [facility] Organization         Address  Phone  Notes  [facility]  [phone]  Domenic Schwab, PhD [address] Arlis Porta [place], [place]   [phone] or [phone]   [place], [place] [phone]   [address], [place], [place] [phone] Insurance/Medicaid/sponsorship through [facility] and Families [address] [place]                                    [place], [place] [phone] [place] [address], [place] [phone]    Dr. Adele Schilder  [phone]   [facility] [place] Dept. 1) [address], [place] 2) [place] 3)  [address], [place] [phone]  [phone]   [place] [phone] or [phone] (After Hours)

## 2015-02-20 ENCOUNTER — Emergency Department (HOSPITAL_COMMUNITY)
Admission: EM | Admit: 2015-02-20 | Discharge: 2015-02-20 | Disposition: A | Discharge status: Home / Self Care | Payer: Self-pay | Attending: Emergency Medicine | Admitting: Emergency Medicine

## 2015-02-20 ENCOUNTER — Encounter (HOSPITAL_COMMUNITY): Payer: Self-pay | Admitting: *Deleted

## 2015-02-20 DIAGNOSIS — Z8669 Personal history of other diseases of the nervous system and sense organs: Secondary | ICD-10-CM | POA: Insufficient documentation

## 2015-02-20 DIAGNOSIS — Z720 Tobacco use: Secondary | ICD-10-CM | POA: Insufficient documentation

## 2015-02-20 DIAGNOSIS — M79642 Pain in left hand: Secondary | ICD-10-CM | POA: Insufficient documentation

## 2015-02-20 DIAGNOSIS — L539 Erythematous condition, unspecified: Secondary | ICD-10-CM | POA: Insufficient documentation

## 2015-02-20 MED ORDER — CEPHALEXIN 250 MG PO CAPS: 250.0000 mg | ORAL_CAPSULE | Freq: Two times a day (BID) | ORAL | Status: DC

## 2015-02-20 MED ORDER — CEPHALEXIN 250 MG PO CAPS
250.0000 mg | ORAL_CAPSULE | Freq: Once | ORAL | Status: AC
  Administered 2015-02-20: 250 mg via ORAL
  Filled 2015-02-20: qty 1

## 2015-02-20 MED ORDER — IBUPROFEN 200 MG PO TABS
600.0000 mg | ORAL_TABLET | Freq: Once | ORAL | Status: AC
  Administered 2015-02-20: 600 mg via ORAL
  Filled 2015-02-20: qty 3

## 2015-02-20 MED ORDER — IBUPROFEN 600 MG PO TABS: 600.0000 mg | ORAL_TABLET | Freq: Once | ORAL | Status: DC

## 2015-02-20 NOTE — Discharge Instructions (Signed)
At this time.  The exact cause for your hand pain is uncertain.  It appears to me asa superficial/steam burn as there is no break in skin, but you've been started on antibiotic as a safety measure.  Please watch the hand carefully for the next several days if you develop new or worsening symptoms, fever, increased swelling, joint pain.  Please return immediately for further evaluation

## 2015-02-20 NOTE — ED Provider Notes (Signed)
CSN: [id number]     Arrival date & time 02/20/15  2211 History  This chart was scribed for non-physician practitioner Junius Creamer, NP, working with Daleen Bo, MD, by Eustaquio Maize, ED Scribe. This patient was seen in room WTR2/WLPT2 and the patient's care was started at 10:39 PM.  Chief Complaint  Patient presents with  . Hand Pain   The history is provided by the patient. No language interpreter was used.     HPI Comments: Austin Mcmahon is a 34 y.o. 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 He denies any injury or trauma to the hand but mentions pulling food out of the heated window today. Pt does wear gloves while working and states they do get wet periodically. The pain began at the wrist and started radiating to his palm and knuckles. Denies weakness, numbness, tingling, or any other associated symptoms.    Past Medical History  Diagnosis Date  . OSA (obstructive sleep apnea)    Past Surgical History  Procedure Laterality Date  . Ganglion cyst excision      from wrist   Family History  Problem Relation Age of Onset  . Hypertension Other   . Diabetes Other   . Cancer     Social History  Substance Use Topics  . Smoking status: Current Every Day Smoker -- 0.50 packs/day    Types: Cigarettes  . Smokeless tobacco: None  . Alcohol Use: Yes     Comment: social    Review of Systems  Musculoskeletal: Positive for arthralgias (Left hand pain). Negative for joint swelling.  Skin: Negative for wound.  Neurological: Negative for weakness and numbness.  All other systems reviewed and are negative.  Allergies  Review of patient's allergies indicates no known allergies.  Home Medications   Prior to Admission medications   Medication Sig Start Date End Date Taking? Authorizing Provider  acetaminophen (TYLENOL) 500 MG tablet Take  1,000 mg by mouth every 6 (six) hours as needed for mild pain.    Historical Provider, MD  cephALEXin (KEFLEX) 250 MG capsule Take 1 capsule (250 mg total) by mouth 2 (two) times daily. 02/20/15   Junius Creamer, NP  ibuprofen (ADVIL,MOTRIN) 600 MG tablet Take 1 tablet (600 mg total) by mouth once. 02/20/15   Junius Creamer, NP  penicillin v potassium (VEETID) 500 MG tablet Take 1 tablet (500 mg total) by mouth once. 02/06/15   Junius Creamer, NP  traMADol (ULTRAM) 50 MG tablet Take 1 tablet (50 mg total) by mouth every 6 (six) hours as needed. 02/06/15   Junius Creamer, NP   Triage Vitals: BP 146/76 mmHg  Pulse 83  Temp(Src) 99 F (37.2 C) (Oral)  Resp 18  SpO2 97%   Physical Exam  Constitutional: He appears well-developed and well-nourished.  HENT:  Head: Normocephalic.  Eyes: Pupils are equal, round, and reactive to light.  Neck: Normal range of motion.  Cardiovascular: Normal rate.   Pulmonary/Chest: Effort normal.  Musculoskeletal: Normal range of motion. He exhibits tenderness. He exhibits no edema.       Hands: Neurological: He is alert.  Skin: There is erythema.  Nursing note and vitals reviewed.   ED Course  Procedures (including critical care time)  DIAGNOSTIC STUDIES: Oxygen Saturation is 97% on RA, normal by my interpretation.    COORDINATION OF CARE: 10:42 PM-Discussed treatment plan  which includes Rx antibiotic with pt at bedside and pt agreed to plan.   Labs Review Labs Reviewed - No data to display  Imaging Review No results found.    EKG Interpretation None    he has slight erythema to the palm and medial aspect of the finger more consistent with a steam burn.  There is no break in the skin.  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  He does work Designer, fashion/clothing at work and to get [employer].  He will be started on antivirals as a preventive/caution, although I feel this is not infectious in  nature.  There is new break in the skin.  He has been given return precautions if he develops new or worsening symptoms  MDM   Final diagnoses:  Hand pain, left    I personally performed the services described in this documentation, which was scribed in my presence. The recorded information has been reviewed and is accurate.     Junius Creamer, NP 02/20/15 2023  Daleen Bo, MD 02/21/15 [phone]

## 2015-02-20 NOTE — ED Notes (Signed)
Pt complains of pain in his left hand since 12pm today. Pt states the pain started at his palm and has spread to his wrist and knuckle.

## 2015-02-25 ENCOUNTER — Encounter (HOSPITAL_COMMUNITY): Payer: Self-pay | Admitting: Emergency Medicine

## 2015-02-25 ENCOUNTER — Emergency Department (HOSPITAL_COMMUNITY)
Admission: EM | Admit: 2015-02-25 | Discharge: 2015-02-25 | Disposition: A | Discharge status: Home / Self Care | Payer: Self-pay | Attending: Emergency Medicine | Admitting: Emergency Medicine

## 2015-02-25 DIAGNOSIS — Z720 Tobacco use: Secondary | ICD-10-CM | POA: Insufficient documentation

## 2015-02-25 DIAGNOSIS — Y929 Unspecified place or not applicable: Secondary | ICD-10-CM | POA: Insufficient documentation

## 2015-02-25 DIAGNOSIS — Z8669 Personal history of other diseases of the nervous system and sense organs: Secondary | ICD-10-CM | POA: Insufficient documentation

## 2015-02-25 DIAGNOSIS — Y999 Unspecified external cause status: Secondary | ICD-10-CM | POA: Insufficient documentation

## 2015-02-25 DIAGNOSIS — T7840XA Allergy, unspecified, initial encounter: Secondary | ICD-10-CM | POA: Insufficient documentation

## 2015-02-25 DIAGNOSIS — Y939 Activity, unspecified: Secondary | ICD-10-CM | POA: Insufficient documentation

## 2015-02-25 MED ORDER — PREDNISONE 20 MG PO TABS: ORAL_TABLET | ORAL | Status: DC

## 2015-02-25 MED ORDER — FAMOTIDINE 20 MG PO TABS: 20.0000 mg | ORAL_TABLET | Freq: Every day | ORAL | Status: DC

## 2015-02-25 MED ORDER — HYDROXYZINE HCL 25 MG PO TABS: 25.0000 mg | ORAL_TABLET | Freq: Four times a day (QID) | ORAL | Status: DC | PRN

## 2015-02-25 MED ORDER — FAMOTIDINE 20 MG PO TABS
20.0000 mg | ORAL_TABLET | Freq: Once | ORAL | Status: AC
Start: 2015-02-25 — End: 2015-02-25
  Administered 2015-02-25: 20 mg via ORAL
  Filled 2015-02-25: qty 1

## 2015-02-25 MED ORDER — HYDROXYZINE HCL 25 MG PO TABS
25.0000 mg | ORAL_TABLET | Freq: Once | ORAL | Status: AC
  Administered 2015-02-25: 25 mg via ORAL
  Filled 2015-02-25: qty 1

## 2015-02-25 MED ORDER — PREDNISONE 20 MG PO TABS
60.0000 mg | ORAL_TABLET | Freq: Once | ORAL | Status: AC
  Administered 2015-02-25: 60 mg via ORAL
  Filled 2015-02-25: qty 3

## 2015-02-25 NOTE — ED Provider Notes (Signed)
CSN: [id number]     Arrival date & time 02/25/15  0110 History  This chart was scribed for Julianne Rice, MD by Hansel Feinstein, ED Scribe. This patient was seen in room WHALB/WHALB and the patient's care was started at 3:07 AM.     No chief complaint on file.  The history is provided by the patient. No language interpreter was used.    HPI Comments: Austin Mcmahon is a 34 y.o. male with Hx of OSA who presents to the Emergency Department complaining of a moderate, intermittent rash to bilateral hands, chest with associated itching to the BUE, BLE, chest onset 5 days ago. He states associated left hand swelling, mild intermittent difficulty swallowing. Pt was seen in the ED on 02/20/15 for left hand pain, swelling and itching. He was given an Rx for Keflex with no relief and discontinued use. He has taken Benadryl (last dose yesterday at 1200) and used hydrocortisone cream with no relief. NKDA. Pt is not currently followed by a PCP. He denies new shampoo, soap, foods. He denies facial swelling or difficulty breathing.   Past Medical History  Diagnosis Date  . OSA (obstructive sleep apnea)    Past Surgical History  Procedure Laterality Date  . Ganglion cyst excision      from wrist   Family History  Problem Relation Age of Onset  . Hypertension Other   . Diabetes Other   . Cancer     Social History  Substance Use Topics  . Smoking status: Current Every Day Smoker -- 0.50 packs/day    Types: Cigarettes  . Smokeless tobacco: None  . Alcohol Use: Yes     Comment: social    Review of Systems  Constitutional: Negative for fever and chills.  HENT: Negative for facial swelling, trouble swallowing and voice change.   Eyes: Negative for visual disturbance.  Respiratory: Negative for shortness of breath, wheezing and stridor.   Cardiovascular: Negative for chest pain, palpitations and leg swelling.  Gastrointestinal: Negative for nausea, vomiting and abdominal pain.  Musculoskeletal:  Negative for back pain, neck pain and neck stiffness.  Skin: Positive for color change and rash.  Neurological: Negative for dizziness, weakness, light-headedness, numbness and headaches.  All other systems reviewed and are negative.   Allergies  Review of patient's allergies indicates no known allergies.  Home Medications   Prior to Admission medications   Medication Sig Start Date End Date Taking? Authorizing Provider  acetaminophen (TYLENOL) 500 MG tablet Take 1,000 mg by mouth every 6 (six) hours as needed for mild pain.    Historical Provider, MD  famotidine (PEPCID) 20 MG tablet Take 1 tablet (20 mg total) by mouth daily. 02/25/15   Julianne Rice, MD  hydrOXYzine (ATARAX/VISTARIL) 25 MG tablet Take 1 tablet (25 mg total) by mouth every 6 (six) hours as needed for itching. 02/25/15   Julianne Rice, MD  ibuprofen (ADVIL,MOTRIN) 600 MG tablet Take 1 tablet (600 mg total) by mouth once. 02/20/15   Junius Creamer, NP  predniSONE (DELTASONE) 20 MG tablet 3 tabs po day one, then 2 po daily x 4 days 02/25/15   Julianne Rice, MD  traMADol (ULTRAM) 50 MG tablet Take 1 tablet (50 mg total) by mouth every 6 (six) hours as needed. 02/06/15   Junius Creamer, NP   BP 136/74 mmHg  Pulse 103  Temp(Src) 98.8 F (37.1 C) (Oral)  Resp 18  SpO2 98% Physical Exam  Constitutional: He is oriented to person, place, and time. He appears  well-developed and well-nourished. No distress.  HENT:  Head: Normocephalic and atraumatic.  Mouth/Throat: Oropharynx is clear and moist. No oropharyngeal exudate.  No facial or intraoral swelling.  Eyes: EOM are normal. Pupils are equal, round, and reactive to light.  Neck: Normal range of motion. Neck supple.  Cardiovascular: Normal rate and regular rhythm.   Pulmonary/Chest: Effort normal and breath sounds normal. No stridor. No respiratory distress. He has no wheezes. He has no rales. He exhibits no tenderness.  Abdominal: Soft. Bowel sounds are normal. He exhibits no  distension and no mass. There is no tenderness. There is no rebound and no guarding.  Musculoskeletal: Normal range of motion. He exhibits no edema or tenderness.  Neurological: He is alert and oriented to person, place, and time.  Skin: Skin is warm and dry. No rash noted. No erythema.  No obvious rash or erythema. Distal pulses intact.  Psychiatric: He has a normal mood and affect. His behavior is normal.  Nursing note and vitals reviewed.   ED Course  Procedures (including critical care time) DIAGNOSTIC STUDIES: Oxygen Saturation is 98% on RA, normal by my interpretation.    COORDINATION OF CARE: 3:12 AM Discussed treatment plan with pt at bedside and pt agreed to plan.   Labs Review Labs Reviewed - No data to display  Imaging Review No results found. I have personally reviewed and evaluated these images and lab results as part of my medical decision-making.   EKG Interpretation None      MDM   Final diagnoses:  Allergic reaction, initial encounter    I personally performed the services described in this documentation, which was scribed in my presence. The recorded information has been reviewed and is accurate.  Patient presents with intermittent rash and itching symptoms. Questionable allergic reaction. Airway is intact. Will start on short course of prednisone. Patient been given extensive return precautions and voiced understanding.  Julianne Rice, MD 02/25/15 [phone]

## 2015-02-25 NOTE — Discharge Instructions (Signed)

## 2015-02-25 NOTE — ED Notes (Signed)
Pt c/o itching, redness and swelling to bilateral arms, hands. Seen Saturday for allergic reaction, given Keflex, no relief. No relief with benadryl at home. Denies respiratory symptoms. Speaking in complete sentences, no vocal changes.

## 2018-07-29 ENCOUNTER — Emergency Department (HOSPITAL_COMMUNITY)
Admission: EM | Admit: 2018-07-29 | Discharge: 2018-07-30 | Disposition: A | Discharge status: Home / Self Care | Payer: Self-pay | Attending: Emergency Medicine | Admitting: Emergency Medicine

## 2018-07-29 ENCOUNTER — Other Ambulatory Visit: Payer: Self-pay

## 2018-07-29 ENCOUNTER — Encounter (HOSPITAL_COMMUNITY): Payer: Self-pay

## 2018-07-29 DIAGNOSIS — Z79899 Other long term (current) drug therapy: Secondary | ICD-10-CM | POA: Insufficient documentation

## 2018-07-29 DIAGNOSIS — H6692 Otitis media, unspecified, left ear: Secondary | ICD-10-CM | POA: Insufficient documentation

## 2018-07-29 DIAGNOSIS — F17210 Nicotine dependence, cigarettes, uncomplicated: Secondary | ICD-10-CM | POA: Insufficient documentation

## 2018-07-29 MED ORDER — IBUPROFEN 800 MG PO TABS
800.0000 mg | ORAL_TABLET | Freq: Once | ORAL | Status: AC
  Administered 2018-07-30: 800 mg via ORAL
  Filled 2018-07-29: qty 1

## 2018-07-29 MED ORDER — AMOXICILLIN 500 MG PO CAPS: 500.0000 mg | ORAL_CAPSULE | Freq: Three times a day (TID) | ORAL | 0 refills | Status: DC

## 2018-07-29 MED ORDER — AMOXICILLIN 500 MG PO CAPS
500.0000 mg | ORAL_CAPSULE | Freq: Once | ORAL | Status: AC
  Administered 2018-07-30: 500 mg via ORAL
  Filled 2018-07-29: qty 1

## 2018-07-29 NOTE — ED Provider Notes (Signed)
TIME SEEN: 11:55 PM  CHIEF COMPLAINT: Bilateral ear pain  HPI: Patient is a 38 year old male with history of sleep apnea who presents to the emergency department with bilateral ear pain.  States he had a "cold" 2 weeks ago where he had cough and congestion and was having ear pain at that time.  States all of his other symptoms have resolved but still having ear pain worse on the left side.  No fever.  No sore throat.  No residual cough.  No vomiting or diarrhea.  No hearing loss.  ROS: See HPI Constitutional: no fever  Eyes: no drainage  ENT: no runny nose   Cardiovascular:  no chest pain  Resp: no SOB  GI: no vomiting GU: no dysuria Integumentary: no rash  Allergy: no hives  Musculoskeletal: no leg swelling  Neurological: no slurred speech ROS otherwise negative  PAST MEDICAL HISTORY/PAST SURGICAL HISTORY:  Past Medical History:  Diagnosis Date  . OSA (obstructive sleep apnea)     MEDICATIONS:  Prior to Admission medications   Medication Sig Start Date End Date Taking? Authorizing Provider  acetaminophen (TYLENOL) 500 MG tablet Take 1,000 mg by mouth every 6 (six) hours as needed for mild pain.    [provider]  amoxicillin (AMOXIL) 500 MG capsule Take 1 capsule (500 mg total) by mouth 3 (three) times daily. 07/29/18   Ward, Delice Bison, DO  famotidine (PEPCID) 20 MG tablet Take 1 tablet (20 mg total) by mouth daily. 02/25/15   Julianne Rice, MD  hydrOXYzine (ATARAX/VISTARIL) 25 MG tablet Take 1 tablet (25 mg total) by mouth every 6 (six) hours as needed for itching. 02/25/15   Julianne Rice, MD  ibuprofen (ADVIL,MOTRIN) 600 MG tablet Take 1 tablet (600 mg total) by mouth once. 02/20/15   Junius Creamer, NP  predniSONE (DELTASONE) 20 MG tablet 3 tabs po day one, then 2 po daily x 4 days 02/25/15   Julianne Rice, MD  traMADol (ULTRAM) 50 MG tablet Take 1 tablet (50 mg total) by mouth every 6 (six) hours as needed. 02/06/15   Junius Creamer, NP    ALLERGIES:  No Known  Allergies  SOCIAL HISTORY:  Social History   Tobacco Use  . Smoking status: Current Every Day Smoker    Packs/day: 0.50    Types: Cigarettes  Substance Use Topics  . Alcohol use: Yes    Comment: social    FAMILY HISTORY: Family History  Problem Relation Age of Onset  . Hypertension Other   . Diabetes Other   . Cancer Unknown     EXAM: BP (!) 171/104 (BP Location: Right Arm)   Pulse 95   Temp 98.7 F (37.1 C) (Oral)   Resp 16   SpO2 98%  CONSTITUTIONAL: Alert and oriented and responds appropriately to questions. Well-appearing; well-nourished HEAD: Normocephalic EYES: Conjunctivae clear, pupils appear equal, EOMI ENT: normal nose; moist mucous membranes; right TM is clear without erythema, purulence, bulging, perforation, effusion.  Left TM is erythematous with bulging and effusion but no purulence or perforation.  No cerumen impaction or sign of foreign body in the external auditory canal. No inflammation, erythema or drainage from the external auditory canal. No signs of mastoiditis. No pain with manipulation of the pinna bilaterally. NECK: Supple, no meningismus, no nuchal rigidity, no LAD  CARD: RRR; S1 and S2 appreciated; no murmurs, no clicks, no rubs, no gallops RESP: Normal chest excursion without splinting or tachypnea; breath sounds clear and equal bilaterally; no wheezes, no rhonchi, no rales,  no hypoxia or respiratory distress, speaking full sentences ABD/GI: Normal bowel sounds; non-distended; soft, non-tender, no rebound, no guarding, no peritoneal signs, no hepatosplenomegaly BACK:  The back appears normal and is non-tender to palpation, there is no CVA tenderness EXT: Normal ROM in all joints; non-tender to palpation; no edema; normal capillary refill; no cyanosis, no calf tenderness or swelling    SKIN: Normal color for age and race; warm; no rash NEURO: Moves all extremities equally PSYCH: The patient's mood and manner are appropriate. Grooming and personal  hygiene are appropriate.  MEDICAL DECISION MAKING: Patient here with left otitis media.  No sign of impaction.  No sign of otitis media.  No sign of mastoiditis.  Patient otherwise well-appearing today.  Will treat with amoxicillin for the next week.  Recommended alternating Tylenol and Motrin for pain.  Will give outpatient PCP follow-up.  Discussed return precautions.  At this time, I do not feel there is any life-threatening condition present. I have reviewed and discussed all results (EKG, imaging, lab, urine as appropriate) and exam findings with patient/family. I have reviewed nursing notes and appropriate previous records.  I feel the patient is safe to be discharged home without further emergent workup and can continue workup as an outpatient as needed. Discussed usual and customary return precautions. Patient/family verbalize understanding and are comfortable with this plan.  Outpatient follow-up has been provided as needed. All questions have been answered.     Ward, Delice Bison, DO 07/29/18 2357

## 2018-07-29 NOTE — ED Triage Notes (Signed)
Pt reports bilateral otalgia. States that the left feels more clogged than the right. Denies fever, N/V. States that he had a head cold last week, but is feeling much better with the exception of his ears. A&Ox4.

## 2018-07-29 NOTE — Discharge Instructions (Signed)
You may alternate Tylenol 1000 mg every 6 hours as needed for pain and Ibuprofen 800 mg every 8 hours as needed for pain.  Please take Ibuprofen with food. ° °Steps to find a Primary Care Provider (PCP): ° °Call [phone] or [phone] to access "[facility] Find a Doctor Service." ° °2.  You may also go on the [facility] website at [email or web address] ° °3.  [facility] also frequently accepts new patients. ° ° [facility]  °[address] °[place] [address] °[phone] ° °4.  There are also multiple [facility], [facility] and [facility] practices throughout the [place] that are frequently accepting new patients. You may find a clinic that is close to your home and contact them. ° °[facility] °[email or web address] °[phone] ° °[facility] °[email or web address] ° °[facility] °[email or web address] °[phone] ° °[facility] °[email or web address] °[phone] ° °5.  Local Health Departments also can provide primary care services. ° °[facility]  °[address] °[place] [address] [phone] ° °[facility] °[address] °[place] [address] [phone] ° °[facility] °[address]  °[place] [address] °[phone] ° ° °

## 2018-11-09 ENCOUNTER — Emergency Department (HOSPITAL_COMMUNITY): Payer: 59

## 2018-11-09 ENCOUNTER — Emergency Department (HOSPITAL_COMMUNITY)
Admission: EM | Admit: 2018-11-09 | Discharge: 2018-11-09 | Disposition: A | Discharge status: Home / Self Care | Payer: 59 | Attending: Emergency Medicine | Admitting: Emergency Medicine

## 2018-11-09 ENCOUNTER — Other Ambulatory Visit: Payer: Self-pay

## 2018-11-09 ENCOUNTER — Encounter (HOSPITAL_COMMUNITY): Payer: Self-pay

## 2018-11-09 DIAGNOSIS — Z79899 Other long term (current) drug therapy: Secondary | ICD-10-CM | POA: Insufficient documentation

## 2018-11-09 DIAGNOSIS — M25561 Pain in right knee: Secondary | ICD-10-CM | POA: Diagnosis present

## 2018-11-09 DIAGNOSIS — F17210 Nicotine dependence, cigarettes, uncomplicated: Secondary | ICD-10-CM | POA: Diagnosis not present

## 2018-11-09 DIAGNOSIS — M25562 Pain in left knee: Secondary | ICD-10-CM | POA: Insufficient documentation

## 2018-11-09 DIAGNOSIS — R030 Elevated blood-pressure reading, without diagnosis of hypertension: Secondary | ICD-10-CM | POA: Diagnosis not present

## 2018-11-09 MED ORDER — NAPROXEN 500 MG PO TABS: 500.0000 mg | ORAL_TABLET | Freq: Two times a day (BID) | ORAL | 0 refills | Status: AC

## 2018-11-09 MED ORDER — KETOROLAC TROMETHAMINE 30 MG/ML IJ SOLN
30.0000 mg | Freq: Once | INTRAMUSCULAR | Status: AC
  Administered 2018-11-09: 30 mg via INTRAMUSCULAR
  Filled 2018-11-09: qty 1

## 2018-11-09 NOTE — ED Notes (Signed)
Patient transported to X-ray 

## 2018-11-09 NOTE — Discharge Instructions (Signed)
You may alternate taking Tylenol and Naproxen as needed for pain control. You may take Naproxen twice daily as directed on your discharge paperwork and you may take  [phone] mg of Tylenol every 6 hours. Do not exceed 4000 mg of Tylenol daily as this can lead to liver damage. Also, make sure to take Naproxen with meals as it can cause an upset stomach. Do not take other NSAIDs while taking Naproxen such as (Aleve, Ibuprofen, Aspirin, Celebrex, etc) and do not take more than the prescribed dose as this can lead to ulcers and bleeding in your GI tract. You may use warm and cold compresses to help with your symptoms.   Please follow up with your primary doctor within the next 7-10 days for re-evaluation of your elevated blood pressure. Please follow up with orthopedics for further treatment of your knee pain.   Please return to the ER sooner if you have any new or worsening symptoms.

## 2018-11-09 NOTE — ED Triage Notes (Signed)
For 3.5 weeks states knee pain after working and taking ibuprofen for the pain steady gait noted.

## 2018-11-09 NOTE — ED Provider Notes (Signed)
[facility] DEPT Provider Note   CSN: [id number] Arrival date & time: 11/09/18  1813    History   Chief Complaint Chief Complaint  Patient presents with  . Knee Pain    HPI Austin Mcmahon is a 38 y.o. male.     HPI   PT is a 38 y/o male with a h/o OSA who presents to the ED today for eval of of bilat knee pain that began 3.5 weeks ago. Denies any inciting injury, but does state that he is on his feet at work all day. He just recently got new shoes.  Denies redness/swelling/warmth to the knees.  Denies fever, abd pain, NVD, URI sxs. Denies hip or back pain   States pain is constant and severe in nature. States he has been taking ibuprofen without relief. Last dose was about 6.5 hours ago.   Past Medical History:  Diagnosis Date  . OSA (obstructive sleep apnea)     There are no active problems to display for this patient.   Past Surgical History:  Procedure Laterality Date  . GANGLION CYST EXCISION     from wrist        Home Medications    Prior to Admission medications   Medication Sig Start Date End Date Taking? Authorizing Provider  acetaminophen (TYLENOL) 500 MG tablet Take 1,000 mg by mouth every 6 (six) hours as needed for mild pain.    [provider]  amoxicillin (AMOXIL) 500 MG capsule Take 1 capsule (500 mg total) by mouth 3 (three) times daily. 07/29/18   Ward, Delice Bison, DO  famotidine (PEPCID) 20 MG tablet Take 1 tablet (20 mg total) by mouth daily. 02/25/15   Julianne Rice, MD  hydrOXYzine (ATARAX/VISTARIL) 25 MG tablet Take 1 tablet (25 mg total) by mouth every 6 (six) hours as needed for itching. 02/25/15   Julianne Rice, MD  ibuprofen (ADVIL,MOTRIN) 600 MG tablet Take 1 tablet (600 mg total) by mouth once. 02/20/15   Junius Creamer, NP  naproxen (NAPROSYN) 500 MG tablet Take 1 tablet (500 mg total) by mouth 2 (two) times daily for 7 days. 11/09/18 11/16/18  Idonna Heeren S, PA-C  predniSONE (DELTASONE) 20 MG  tablet 3 tabs po day one, then 2 po daily x 4 days 02/25/15   Julianne Rice, MD  traMADol (ULTRAM) 50 MG tablet Take 1 tablet (50 mg total) by mouth every 6 (six) hours as needed. 02/06/15   Junius Creamer, NP    Family History Family History  Problem Relation Age of Onset  . Hypertension Other   . Diabetes Other   . Cancer Other     Social History Social History   Tobacco Use  . Smoking status: Current Every Day Smoker    Packs/day: 0.50    Types: Cigarettes  . Smokeless tobacco: Never Used  Substance Use Topics  . Alcohol use: Yes    Comment: social  . Drug use: No     Allergies   Patient has no known allergies.   Review of Systems Review of Systems  Constitutional: Negative for fever.  Respiratory: Negative for cough and shortness of breath.   Cardiovascular: Negative for chest pain.  Gastrointestinal: Negative for abdominal pain, constipation, diarrhea, nausea and vomiting.  Musculoskeletal: Negative for joint swelling.       Bilat knee pain  Skin: Negative for color change.  Neurological: Negative for weakness, numbness and headaches.     Physical Exam Updated Vital Signs BP (!) 160/98  Pulse 88   Temp 98.5 F (36.9 C) (Oral)   Resp 18   Ht 5\' 6"  (1.676 m)   Wt 122.5 kg   SpO2 100%   BMI 43.58 kg/m   Physical Exam Constitutional:      General: He is not in acute distress.    Appearance: He is well-developed.  Eyes:     Conjunctiva/sclera: Conjunctivae normal.  Cardiovascular:     Rate and Rhythm: Normal rate and regular rhythm.  Pulmonary:     Effort: Pulmonary effort is normal.     Breath sounds: Normal breath sounds.  Abdominal:     Palpations: Abdomen is soft.     Tenderness: There is no abdominal tenderness.  Musculoskeletal:     Comments: TTP over the right patella and along the medial and lateral joint lines. TTP over the left patella and along the medial joint line.  No effusion noted bilaterally. No erythema, swelling or warmth to  the bilat knees. FROM of the bilat knees. Ambulatory with mild limp. No pain to bilat hips with axial loading.  Skin:    General: Skin is warm and dry.  Neurological:     Mental Status: He is alert and oriented to person, place, and time.      ED Treatments / Results  Labs (all labs ordered are listed, but only abnormal results are displayed) Labs Reviewed - No data to display  EKG None  Radiology Dg Knee Complete 4 Views Left  Result Date: 11/09/2018 CLINICAL DATA:  Bilateral knee pain for 3.5 weeks EXAM: LEFT KNEE - COMPLETE 4+ VIEW COMPARISON:  None. FINDINGS: No acute fracture or dislocation. Minimal medial femorotibial compartment joint space narrowing. No aggressive osseous lesion. No joint effusion. Soft tissues are normal. IMPRESSION: No acute osseous injury of the left knee. Electronically Signed   By: Kathreen Devoid   On: 11/09/2018 19:22   Dg Knee Complete 4 Views Right  Result Date: 11/09/2018 CLINICAL DATA:  BILATERAL knee pain, has been taking ibuprofen, no known injury EXAM: RIGHT KNEE - COMPLETE 4+ VIEW COMPARISON:  None FINDINGS: Osseous mineralization normal. Joint spaces preserved. No fracture, dislocation, or bone destruction. No joint effusion. IMPRESSION: Normal exam. Electronically Signed   By: Lavonia Dana M.D.   On: 11/09/2018 19:23    Procedures Procedures (including critical care time)  Medications Ordered in ED Medications  ketorolac (TORADOL) 30 MG/ML injection 30 mg (30 mg Intramuscular Given 11/09/18 1924)     Initial Impression / Assessment and Plan / ED Course  I have reviewed the triage vital signs and the nursing notes.  Pertinent labs & imaging results that were available during my care of the patient were reviewed by me and considered in my medical decision making (see chart for details).      Final Clinical Impressions(s) / ED Diagnoses   Final diagnoses:  Acute pain of both knees  Elevated blood pressure reading   Pt c/o bilat knee  pain that began 3.5 weeks ago. Denies any inciting injury, but does state that he is on his feet at work all day.  Denies redness/swelling to the knees.  Denies fever, hip or back pain   Pt afebrile, is somewhat hypertensive (feel like is likely secondary to pain), otherwise vitals are reassuring.   TTP over the right patella and along the medial and lateral joint lines. TTP over the left patella and along the medial joint line.  No effusion noted bilaterally. No erythema, swelling or warmth to the  bilat knees. FROM of the bilat knees. Ambulatory with mild limp. No pain to bilat hips with axial loading.   Xray of the left knee with minimal medial femorotibial compartment joint space narrowing. Otherwise no acute abnormality. Xray of the right knee is normal.  Pt was given dose of toradol and bilat knee sleeves and he reports mild improvement of symptoms. He has no clinical evidence of septic joint this is unlikely esp because sxs are bilateral. His weight may be a factor in his pain. I will give orthopedics f/u. I also advised pcp f/u in regards to his bp. I feel his elevated pressures are likely secondary to pain. Do not suspect HTN emergency as he is asx. Advised on return precautions. He voices understanding of the plan and reasons to return. All questions answered. Pt stable for d/c.  ED Discharge Orders         Ordered    naproxen (NAPROSYN) 500 MG tablet  2 times daily     11/09/18 2001           Bishop Dublin 11/09/18 2001    Lacretia Leigh, MD 11/10/18 2240

## 2018-11-14 ENCOUNTER — Telehealth: Payer: Self-pay

## 2018-11-14 NOTE — Telephone Encounter (Signed)
Called and left a Vm for patient to CB to confirm appointment for Friday, 11/15/2018.

## 2018-11-15 ENCOUNTER — Encounter: Payer: Self-pay | Admitting: Orthopaedic Surgery

## 2018-11-15 ENCOUNTER — Other Ambulatory Visit: Payer: Self-pay

## 2018-11-15 ENCOUNTER — Ambulatory Visit (INDEPENDENT_AMBULATORY_CARE_PROVIDER_SITE_OTHER): Payer: 59 | Admitting: Orthopaedic Surgery

## 2018-11-15 VITALS — Ht 66.5 in | Wt 280.0 lb

## 2018-11-15 DIAGNOSIS — M25561 Pain in right knee: Secondary | ICD-10-CM

## 2018-11-15 DIAGNOSIS — M2550 Pain in unspecified joint: Secondary | ICD-10-CM

## 2018-11-15 DIAGNOSIS — M25562 Pain in left knee: Secondary | ICD-10-CM

## 2018-11-15 MED ORDER — NAPROXEN 500 MG PO TABS: 500.0000 mg | ORAL_TABLET | Freq: Two times a day (BID) | ORAL | 0 refills | Status: DC | PRN

## 2018-11-15 NOTE — Progress Notes (Signed)
Office Visit Note   Patient: Austin Mcmahon           Date of Birth: 10/21/1980           MRN: [id number] Visit Date: 11/15/2018              Requested by: No referring provider defined for this encounter. PCP: Patient, No Pcp Per   Assessment & Plan: Visit Diagnoses:  1. Polyarthralgia   2. Acute pain of both knees     Plan: Since patient is currently doing better he can continue the naproxen.  I did refill prescription for 500 mg p.o. twice daily as needed with food.  Blood work today to check CBC and arthritis panel.  Patient will follow-up in 2 weeks for recheck to review his labs and see how he is feeling.  If he has another flare of his symptoms he will return sooner.  All questions answered.  Follow-Up Instructions: Return in about 2 weeks (around 11/29/2018) for lab review.   Orders:  Orders Placed This Encounter  Procedures  . CBC  . Antinuclear Antib (ANA)  . Uric acid  . Rheumatoid Factor  . Sed Rate (ESR)   Meds ordered this encounter  Medications  . naproxen (NAPROSYN) 500 MG tablet    Sig: Take 1 tablet (500 mg total) by mouth 2 (two) times daily as needed (must be taken with food).    Dispense:  60 tablet    Refill:  0      Procedures: No procedures performed   Clinical Data: No additional findings.   Subjective: Chief Complaint  Patient presents with  . Left Knee - Pain  . Right Knee - Pain    HPI 38 year old black male comes in today for evaluation of bilateral knee pain.  Patient states that he had sudden onset of knee pain about 3 weeks ago.  No injury.  Did have some swelling with this.  He denies personal history of gout.  No known family history of inflammatory arthropathy.  States that he has never had problems with his knees in the past.  No other joints flared up like this previously or since onset.  Knee pain became more severe and he did go to the emergency room Nov 09, 2018.  He was given a Toradol injection and prescribed  naproxen.  States that this did help to improve his knee pain and he has gotten better.  No mechanical symptoms or feeling of instability.  No lumbar hip or radicular component. Review of Systems No current cardiac pulmonary GI GU issues  Objective: Vital Signs: Ht 5' 6.5" (1.689 m)   Wt 280 lb (127 kg)   BMI 44.52 kg/m   Physical Exam Constitutional:      Appearance: Normal appearance.  HENT:     Head: Normocephalic.  Eyes:     Extraocular Movements: Extraocular movements intact.     Pupils: Pupils are equal, round, and reactive to light.  Pulmonary:     Effort: No respiratory distress.  Musculoskeletal:     Comments: Gait is normal.  Negative logroll bilateral hips.  Bilateral knees good range of motion.  No patellofemoral crepitus.  Mildly tender bilateral medial plica's.  Bilateral knees joint line nontender.  Cruciate collateral ligaments are stable.  Negative patellar apprehension.  No redness, swelling or effusion.  No signs infection.  Bilateral calves nontender.  Neurovascular intact.  Neurological:     General: No focal deficit present.  Mental Status: He is alert.  Psychiatric:        Mood and Affect: Mood normal.        Behavior: Behavior normal.     Ortho Exam  Specialty Comments:  No specialty comments available.  Imaging: No results found.   PMFS History: There are no active problems to display for this patient.  Past Medical History:  Diagnosis Date  . OSA (obstructive sleep apnea)     Family History  Problem Relation Age of Onset  . Hypertension Other   . Diabetes Other   . Cancer Other     Past Surgical History:  Procedure Laterality Date  . GANGLION CYST EXCISION     from wrist   Social History   Occupational History  . Not on file  Tobacco Use  . Smoking status: Current Every Day Smoker    Packs/day: 0.50    Types: Cigarettes  . Smokeless tobacco: Never Used  Substance and Sexual Activity  . Alcohol use: Yes    Comment:  social  . Drug use: No  . Sexual activity: Not on file

## 2018-11-19 LAB — CBC
HCT: 44.1 % (ref 38.5–50.0)
Hemoglobin: 15.5 g/dL (ref 13.2–17.1)
MCH: 29.8 pg (ref 27.0–33.0)
MCHC: 35.1 g/dL (ref 32.0–36.0)
MCV: 84.6 fL (ref 80.0–100.0)
MPV: 9.7 fL (ref 7.5–12.5)
Platelets: 346 10*3/uL (ref 140–400)
RBC: 5.21 10*6/uL (ref 4.20–5.80)
RDW: 13.1 % (ref 11.0–15.0)
WBC: 5 10*3/uL (ref 3.8–10.8)

## 2018-11-19 LAB — SED RATE MANUAL WEST RFLX: SED RATE BY MODIFIED WESTERGREN,MANUAL: 23 mm/h — ABNORMAL HIGH (ref 0–15)

## 2018-11-19 LAB — ANTI-NUCLEAR AB-TITER (ANA TITER): ANA Titer 1: 1:40 {titer} — ABNORMAL HIGH

## 2018-11-19 LAB — ANA: Anti Nuclear Antibody (ANA): POSITIVE — AB

## 2018-11-19 LAB — SEDIMENTATION RATE

## 2018-11-19 LAB — RHEUMATOID FACTOR: Rhuematoid fact SerPl-aCnc: <14 IU/mL (ref <14)

## 2018-11-19 LAB — URIC ACID: Uric Acid, Serum: 5.5 mg/dL (ref 4.0–8.0)

## 2018-11-29 ENCOUNTER — Other Ambulatory Visit: Payer: Self-pay

## 2018-11-29 ENCOUNTER — Ambulatory Visit (INDEPENDENT_AMBULATORY_CARE_PROVIDER_SITE_OTHER): Payer: 59 | Admitting: Orthopaedic Surgery

## 2018-11-29 ENCOUNTER — Encounter: Payer: Self-pay | Admitting: Orthopaedic Surgery

## 2018-11-29 VITALS — Ht 66.5 in | Wt 280.0 lb

## 2018-11-29 DIAGNOSIS — M25562 Pain in left knee: Secondary | ICD-10-CM | POA: Diagnosis not present

## 2018-11-29 MED ORDER — NAPROXEN 500 MG PO TABS: 500.0000 mg | ORAL_TABLET | Freq: Two times a day (BID) | ORAL | 2 refills | Status: DC

## 2018-11-29 NOTE — Progress Notes (Signed)
Office Visit Note   Patient: Austin Mcmahon           Date of Birth: [birth year]           MRN: [id number] Visit Date: 11/29/2018              Requested by: No referring provider defined for this encounter. PCP: Patient, No Pcp Per   Assessment & Plan: Visit Diagnoses:  1. Left knee pain, unspecified chronicity     Plan: Copy of patient's lab work arthritis panel given to the patient he had a borderline ANA with titer of 1: 40.  He will continue the Naprosyn for 2 months and can take it once a day and then stop.  We discussed weight loss walking program isometric quads strengthening.  He can return if he has increased problems.  Follow-Up Instructions: Return if symptoms worsen or fail to improve.   Orders:  No orders of the defined types were placed in this encounter.  Meds ordered this encounter  Medications  . naproxen (NAPROSYN) 500 MG tablet    Sig: Take 1 tablet (500 mg total) by mouth 2 (two) times daily with a meal.    Dispense:  60 tablet    Refill:  2      Procedures: No procedures performed   Clinical Data: No additional findings.   Subjective: Chief Complaint  Patient presents with  . Left Knee - Follow-up  . Right Knee - Follow-up    HPI 38 year old male returns for bilateral knee pain.  He has been taking Naprosyn and states that he is feeling much better has no pain or discomfort at this point.  He started doing some walking and is trying to work on some weight loss.  Patient had positive ANA with 1: 40 titer with speckled pattern.  Sed rate was only 24 rheumatoid factor was negative uric acid was normal at 5.5.  CBC was normal.  Patient denies any chills fevers no hip pain no numbness or tingling in his feet no other joint symptoms.  Review of Systems update done 14 point unchanged from last office visit.  No uveitis no esophageal problems no skin deformities no rash.  Negative for other rheumatologic problems.   Objective: Vital Signs: Ht 5'  6.5" (1.689 m)   Wt 280 lb (127 kg)   BMI 44.52 kg/m   Physical Exam Constitutional:      Appearance: He is well-developed.  HENT:     Head: Normocephalic and atraumatic.  Eyes:     Pupils: Pupils are equal, round, and reactive to light.  Neck:     Thyroid: No thyromegaly.     Trachea: No tracheal deviation.  Cardiovascular:     Rate and Rhythm: Normal rate.  Pulmonary:     Effort: Pulmonary effort is normal.     Breath sounds: No wheezing.  Abdominal:     General: Bowel sounds are normal.     Palpations: Abdomen is soft.  Skin:    General: Skin is warm and dry.     Capillary Refill: Capillary refill takes less than 2 seconds.  Neurological:     Mental Status: He is alert and oriented to person, place, and time.  Psychiatric:        Behavior: Behavior normal.        Thought Content: Thought content normal.        Judgment: Judgment normal.     Ortho Exam full knee range of motion right and  left he gets rapidly from sitting to standing ambulates without a knee limp.  Negative logroll the hips no rash over exposed skin.  Specialty Comments:  No specialty comments available.  Imaging: No results found.   PMFS History: There are no active problems to display for this patient.  Past Medical History:  Diagnosis Date  . OSA (obstructive sleep apnea)     Family History  Problem Relation Age of Onset  . Hypertension Other   . Diabetes Other   . Cancer Other     Past Surgical History:  Procedure Laterality Date  . GANGLION CYST EXCISION     from wrist   Social History   Occupational History  . Not on file  Tobacco Use  . Smoking status: Current Every Day Smoker    Packs/day: 0.50    Types: Cigarettes  . Smokeless tobacco: Never Used  Substance and Sexual Activity  . Alcohol use: Yes    Comment: social  . Drug use: No  . Sexual activity: Not on file

## 2018-12-08 ENCOUNTER — Telehealth: Payer: Self-pay | Admitting: Adult Health

## 2018-12-08 NOTE — Telephone Encounter (Signed)
Spoke with patient regarding recent visit with [facility]. Discussed [facility] potential exposure and the availability of drive thru testing if interested. Austin Mcmahon declined at this time and the number was given for the call center if he changed his mind.

## 2019-06-16 ENCOUNTER — Other Ambulatory Visit: Payer: Self-pay

## 2019-06-17 ENCOUNTER — Encounter: Payer: Self-pay | Admitting: Family Medicine

## 2019-06-17 ENCOUNTER — Ambulatory Visit (INDEPENDENT_AMBULATORY_CARE_PROVIDER_SITE_OTHER): Payer: 59 | Admitting: Family Medicine

## 2019-06-17 VITALS — BP 118/64 | HR 89 | Temp 97.3°F | Ht 67.0 in | Wt 291.8 lb

## 2019-06-17 DIAGNOSIS — Z0001 Encounter for general adult medical examination with abnormal findings: Secondary | ICD-10-CM | POA: Diagnosis not present

## 2019-06-17 DIAGNOSIS — F17200 Nicotine dependence, unspecified, uncomplicated: Secondary | ICD-10-CM

## 2019-06-17 DIAGNOSIS — L720 Epidermal cyst: Secondary | ICD-10-CM

## 2019-06-17 DIAGNOSIS — G4733 Obstructive sleep apnea (adult) (pediatric): Secondary | ICD-10-CM

## 2019-06-17 DIAGNOSIS — E66813 Obesity, class 3: Secondary | ICD-10-CM | POA: Insufficient documentation

## 2019-06-17 DIAGNOSIS — F17210 Nicotine dependence, cigarettes, uncomplicated: Secondary | ICD-10-CM | POA: Insufficient documentation

## 2019-06-17 LAB — URINALYSIS, ROUTINE W REFLEX MICROSCOPIC
Bilirubin Urine: NEGATIVE
Hgb urine dipstick: NEGATIVE
Ketones, ur: NEGATIVE
Leukocytes,Ua: NEGATIVE
Nitrite: NEGATIVE
RBC / HPF: NONE SEEN (ref 0–?)
Specific Gravity, Urine: 1.025 (ref 1.000–1.030)
Total Protein, Urine: NEGATIVE
Urine Glucose: NEGATIVE
Urobilinogen, UA: 0.2 (ref 0.0–1.0)
WBC, UA: NONE SEEN (ref 0–?)
pH: 6.5 (ref 5.0–8.0)

## 2019-06-17 LAB — COMPREHENSIVE METABOLIC PANEL
ALT: 44 U/L (ref 0–53)
AST: 28 U/L (ref 0–37)
Albumin: 4.3 g/dL (ref 3.5–5.2)
Alkaline Phosphatase: 56 U/L (ref 39–117)
BUN: 18 mg/dL (ref 6–23)
CO2: 26 mEq/L (ref 19–32)
Calcium: 9.4 mg/dL (ref 8.4–10.5)
Chloride: 105 mEq/L (ref 96–112)
Creatinine, Ser: 1.03 mg/dL (ref 0.40–1.50)
GFR: 98.11 mL/min (ref >60.00)
Glucose, Bld: 103 mg/dL — ABNORMAL HIGH (ref 70–99)
Potassium: 4.6 mEq/L (ref 3.5–5.1)
Sodium: 139 mEq/L (ref 135–145)
Total Bilirubin: 0.3 mg/dL (ref 0.2–1.2)
Total Protein: 7 g/dL (ref 6.0–8.3)

## 2019-06-17 LAB — CBC
HCT: 47.3 % (ref 39.0–52.0)
Hemoglobin: 16.3 g/dL (ref 13.0–17.0)
MCHC: 34.5 g/dL (ref 30.0–36.0)
MCV: 90.2 fl (ref 78.0–100.0)
Platelets: 241 10*3/uL (ref 150.0–400.0)
RBC: 5.25 Mil/uL (ref 4.22–5.81)
RDW: 14.1 % (ref 11.5–15.5)
WBC: 5.6 10*3/uL (ref 4.0–10.5)

## 2019-06-17 LAB — LIPID PANEL
Cholesterol: 202 mg/dL — ABNORMAL HIGH (ref 0–200)
HDL: 31.3 mg/dL — ABNORMAL LOW (ref >39.00)
LDL Cholesterol: 148 mg/dL — ABNORMAL HIGH (ref 0–99)
NonHDL: 171.02
Total CHOL/HDL Ratio: 6
Triglycerides: 117 mg/dL (ref 0.0–149.0)
VLDL: 23.4 mg/dL (ref 0.0–40.0)

## 2019-06-17 LAB — HEMOGLOBIN A1C: Hgb A1c MFr Bld: 5.5 % (ref 4.6–6.5)

## 2019-06-17 LAB — TSH: TSH: 1.51 u[IU]/mL (ref 0.35–4.50)

## 2019-06-17 NOTE — Patient Instructions (Signed)
Epidermal Cyst  An epidermal cyst is a sac made of skin tissue. The sac contains a substance called keratin. Keratin is a protein that is normally secreted through the hair follicles. When keratin becomes trapped in the top layer of skin (epidermis), it can form an epidermal cyst. Epidermal cysts can be found anywhere on your body. These cysts are usually harmless (benign), and they may not cause symptoms unless they become infected. What are the causes? This condition may be caused by:  A blocked hair follicle.  A hair that curls and re-enters the skin instead of growing straight out of the skin (ingrown hair).  A blocked pore.  Irritated skin.  An injury to the skin.  Certain conditions that are passed along from parent to child (inherited).  Human papillomavirus (HPV).  Long-term (chronic) sun damage to the skin. What increases the risk? The following factors may make you more likely to develop an epidermal cyst:  Having acne.  Being overweight.  Being 30-40 years old. What are the signs or symptoms? The only symptom of this condition may be a small, painless lump underneath the skin. When an epidermal cyst ruptures, it may become infected. Symptoms may include:  Redness.  Inflammation.  Tenderness.  Warmth.  Fever.  Keratin draining from the cyst. Keratin is grayish-white, bad-smelling substance.  Pus draining from the cyst. How is this diagnosed? This condition is diagnosed with a physical exam.  In some cases, you may have a sample of tissue (biopsy) taken from your cyst to be examined under a microscope or tested for bacteria.  You may be referred to a health care provider who specializes in skin care (dermatologist). How is this treated? In many cases, epidermal cysts go away on their own without treatment. If a cyst becomes infected, treatment may include:  Opening and draining the cyst, done by a health care provider. After draining, minor surgery to  remove the rest of the cyst may be done.  Antibiotic medicine.  Injections of medicines (steroids) that help to reduce inflammation.  Surgery to remove the cyst. Surgery may be done if the cyst: ? Becomes large. ? Bothers you. ? Has a chance of turning into cancer.  Do not try to open a cyst yourself. Follow these instructions at home:  Take over-the-counter and prescription medicines only as told by your health care provider.  If you were prescribed an antibiotic medicine, take it it as told by your health care provider. Do not stop using the antibiotic even if you start to feel better.  Keep the area around your cyst clean and dry.  Wear loose, dry clothing.  Avoid touching your cyst.  Check your cyst every day for signs of infection. Check for: ? Redness, swelling, or pain. ? Fluid or blood. ? Warmth. ? Pus or a bad smell.  Keep all follow-up visits as told by your health care provider. This is important. How is this prevented?  Wear clean, dry, clothing.  Avoid wearing tight clothing.  Keep your skin clean and dry. Take showers or baths every day. Contact a health care provider if:  Your cyst develops symptoms of infection.  Your condition is not improving or is getting worse.  You develop a cyst that looks different from other cysts you have had.  You have a fever. Get help right away if:  Redness spreads from the cyst into the surrounding area. Summary  An epidermal cyst is a sac made of skin tissue. These cysts are   usually harmless (benign), and they may not cause symptoms unless they become infected.  If a cyst becomes infected, treatment may include surgery to open and drain the cyst, or to remove it. Treatment may also include medicines by mouth or through an injection.  Take over-the-counter and prescription medicines only as told by your health care provider. If you were prescribed an antibiotic medicine, take it as told by your health care  provider. Do not stop using the antibiotic even if you start to feel better.  Contact a health care provider if your condition is not improving or is getting worse.  Keep all follow-up visits as told by your health care provider. This is important. This information is not intended to replace advice given to you by your health care provider. Make sure you discuss any questions you have with your health care provider. Document Released: 05/27/2004 Document Revised: 10/17/2018 Document Reviewed: 01/07/2018 Elsevier Patient Education  2020 [employer].  Exercising to Lose Weight Exercise is structured, repetitive physical activity to improve fitness and health. Getting regular exercise is important for everyone. It is especially important if you are overweight. Being overweight increases your risk of heart disease, stroke, diabetes, high blood pressure, and several types of cancer. Reducing your calorie intake and exercising can help you lose weight. Exercise is usually categorized as moderate or vigorous intensity. To lose weight, most people need to do a certain amount of moderate-intensity or vigorous-intensity exercise each week. Moderate-intensity exercise  Moderate-intensity exercise is any activity that gets you moving enough to burn at least three times more energy (calories) than if you were sitting. Examples of moderate exercise include:  Walking a mile in 15 minutes.  Doing light yard work.  Biking at an easy pace. Most people should get at least 150 minutes (2 hours and 30 minutes) a week of moderate-intensity exercise to maintain their body weight. Vigorous-intensity exercise Vigorous-intensity exercise is any activity that gets you moving enough to burn at least six times more calories than if you were sitting. When you exercise at this intensity, you should be working hard enough that you are not able to carry on a conversation. Examples of vigorous exercise include:  Running.   Playing a team sport, such as football, basketball, and soccer.  Jumping rope. Most people should get at least 75 minutes (1 hour and 15 minutes) a week of vigorous-intensity exercise to maintain their body weight. How can exercise affect me? When you exercise enough to burn more calories than you eat, you lose weight. Exercise also reduces body fat and builds muscle. The more muscle you have, the more calories you burn. Exercise also:  Improves mood.  Reduces stress and tension.  Improves your overall fitness, flexibility, and endurance.  Increases bone strength. The amount of exercise you need to lose weight depends on:  Your age.  The type of exercise.  Any health conditions you have.  Your overall physical ability. Talk to your health care provider about how much exercise you need and what types of activities are safe for you. What actions can I take to lose weight? Nutrition   Make changes to your diet as told by your health care provider or diet and nutrition specialist (dietitian). This may include: ? Eating fewer calories. ? Eating more protein. ? Eating less unhealthy fats. ? Eating a diet that includes fresh fruits and vegetables, whole grains, low-fat dairy products, and lean protein. ? Avoiding foods with added fat, salt, and sugar.  Drink  plenty of water while you exercise to prevent dehydration or heat stroke. Activity  Choose an activity that you enjoy and set realistic goals. Your health care provider can help you make an exercise plan that works for you.  Exercise at a moderate or vigorous intensity most days of the week. ? The intensity of exercise may vary from person to person. You can tell how intense a workout is for you by paying attention to your breathing and heartbeat. Most people will notice their breathing and heartbeat get faster with more intense exercise.  Do resistance training twice each week, such as: ? Push-ups. ? Sit-ups. ? Lifting  weights. ? Using resistance bands.  Getting short amounts of exercise can be just as helpful as long structured periods of exercise. If you have trouble finding time to exercise, try to include exercise in your daily routine. ? Get up, stretch, and walk around every 30 minutes throughout the day. ? Go for a walk during your lunch break. ? Park your car farther away from your destination. ? If you take public transportation, get off one stop early and walk the rest of the way. ? Make phone calls while standing up and walking around. ? Take the stairs instead of elevators or escalators.  Wear comfortable clothes and shoes with good support.  Do not exercise so much that you hurt yourself, feel dizzy, or get very short of breath. Where to find more information  [place] Department of Health and Human Services: [employer]  Centers for Disease Control and Prevention (CDC): http://www.wolf.info/ Contact a health care provider:  Before starting a new exercise program.  If you have questions or concerns about your weight.  If you have a medical problem that keeps you from exercising. Get help right away if you have any of the following while exercising:  Injury.  Dizziness.  Difficulty breathing or shortness of breath that does not go away when you stop exercising.  Chest pain.  Rapid heartbeat. Summary  Being overweight increases your risk of heart disease, stroke, diabetes, high blood pressure, and several types of cancer.  Losing weight happens when you burn more calories than you eat.  Reducing the amount of calories you eat in addition to getting regular moderate or vigorous exercise each week helps you lose weight. This information is not intended to replace advice given to you by your health care provider. Make sure you discuss any questions you have with your health care provider. Document Released: 07/29/2010 Document Revised: 07/09/2017 Document Reviewed: 07/09/2017 Elsevier  Patient Education  2020 [place] Maintenance, Male Adopting a healthy lifestyle and getting preventive care are important in promoting health and wellness. Ask your health care provider about:  The right schedule for you to have regular tests and exams.  Things you can do on your own to prevent diseases and keep yourself healthy. What should I know about diet, weight, and exercise? Eat a healthy diet   Eat a diet that includes plenty of vegetables, fruits, low-fat dairy products, and lean protein.  Do not eat a lot of foods that are high in solid fats, added sugars, or sodium. Maintain a healthy weight Body mass index (BMI) is a measurement that can be used to identify possible weight problems. It estimates body fat based on height and weight. Your health care provider can help determine your BMI and help you achieve or maintain a healthy weight. Get regular exercise Get regular exercise. This is one of the most  important things you can do for your health. Most adults should:  Exercise for at least 150 minutes each week. The exercise should increase your heart rate and make you sweat (moderate-intensity exercise).  Do strengthening exercises at least twice a week. This is in addition to the moderate-intensity exercise.  Spend less time sitting. Even light physical activity can be beneficial. Watch cholesterol and blood lipids Have your blood tested for lipids and cholesterol at 37 years of age, then have this test every 5 years. You may need to have your cholesterol levels checked more often if:  Your lipid or cholesterol levels are high.  You are older than 37 years of age.  You are at high risk for heart disease. What should I know about cancer screening? Many types of cancers can be detected early and may often be prevented. Depending on your health history and family history, you may need to have cancer screening at various ages. This may include screening for:   Colorectal cancer.  Prostate cancer.  Skin cancer.  Lung cancer. What should I know about heart disease, diabetes, and high blood pressure? Blood pressure and heart disease  High blood pressure causes heart disease and increases the risk of stroke. This is more likely to develop in people who have high blood pressure readings, are of African descent, or are overweight.  Talk with your health care provider about your target blood pressure readings.  Have your blood pressure checked: ? Every 3-5 years if you are 52-83 years of age. ? Every year if you are 65 years old or older.  If you are between the ages of 37 and 34 and are a current or former smoker, ask your health care provider if you should have a one-time screening for abdominal aortic aneurysm (AAA). Diabetes Have regular diabetes screenings. This checks your fasting blood sugar level. Have the screening done:  Once every three years after age 10 if you are at a normal weight and have a low risk for diabetes.  More often and at a younger age if you are overweight or have a high risk for diabetes. What should I know about preventing infection? Hepatitis B If you have a higher risk for hepatitis B, you should be screened for this virus. Talk with your health care provider to find out if you are at risk for hepatitis B infection. Hepatitis C Blood testing is recommended for:  Everyone born from [birth year] through 1964.  Anyone with known risk factors for hepatitis C. Sexually transmitted infections (STIs)  You should be screened each year for STIs, including gonorrhea and chlamydia, if: ? You are sexually active and are younger than 37 years of age. ? You are older than 37 years of age and your health care provider tells you that you are at risk for this type of infection. ? Your sexual activity has changed since you were last screened, and you are at increased risk for chlamydia or gonorrhea. Ask your health care provider if you  are at risk.  Ask your health care provider about whether you are at high risk for HIV. Your health care provider may recommend a prescription medicine to help prevent HIV infection. If you choose to take medicine to prevent HIV, you should first get tested for HIV. You should then be tested every 3 months for as long as you are taking the medicine. Follow these instructions at home: Lifestyle  Do not use any products that contain nicotine or tobacco, such  as cigarettes, e-cigarettes, and chewing tobacco. If you need help quitting, ask your health care provider.  Do not use street drugs.  Do not share needles.  Ask your health care provider for help if you need support or information about quitting drugs. Alcohol use  Do not drink alcohol if your health care provider tells you not to drink.  If you drink alcohol: ? Limit how much you have to 0-2 drinks a day. ? Be aware of how much alcohol is in your drink. In the [place] one drink equals one 12 oz bottle of beer (355 mL), one 5 oz glass of wine (148 mL), or one 1 oz glass of hard liquor (44 mL). General instructions  Schedule regular health, dental, and eye exams.  Stay current with your vaccines.  Tell your health care provider if: ? You often feel depressed. ? You have ever been abused or do not feel safe at home. Summary  Adopting a healthy lifestyle and getting preventive care are important in promoting health and wellness.  Follow your health care provider's instructions about healthy diet, exercising, and getting tested or screened for diseases.  Follow your health care provider's instructions on monitoring your cholesterol and blood pressure. This information is not intended to replace advice given to you by your health care provider. Make sure you discuss any questions you have with your health care provider. Document Released: 12/23/2007 Document Revised: 06/19/2018 Document Reviewed: 06/19/2018 Elsevier Patient  Education  2020 [place] [employer].  Obesity, Adult Obesity is the condition of having too much total body fat. Being overweight or obese means that your weight is greater than what is considered healthy for your body size. Obesity is determined by a measurement called BMI. BMI is an estimate of body fat and is calculated from height and weight. For adults, a BMI of 30 or higher is considered obese. Obesity can lead to other health concerns and major illnesses, including:  Stroke.  Coronary artery disease (CAD).  Type 2 diabetes.  Some types of cancer, including cancers of the colon, breast, uterus, and gallbladder.  Osteoarthritis.  High blood pressure (hypertension).  High cholesterol.  Sleep apnea.  Gallbladder stones.  Infertility problems. What are the causes? Common causes of this condition include:  Eating daily meals that are high in calories, sugar, and fat.  Being born with genes that may make you more likely to become obese.  Having a medical condition that causes obesity, including: ? Hypothyroidism. ? Polycystic ovarian syndrome (PCOS). ? Binge-eating disorder. ? Cushing syndrome.  Taking certain medicines, such as steroids, antidepressants, and seizure medicines.  Not being physically active (sedentary lifestyle).  Not getting enough sleep.  Drinking high amounts of sugar-sweetened beverages, such as soft drinks. What increases the risk? The following factors may make you more likely to develop this condition:  Having a family history of obesity.  Being a woman of African American descent.  Being a man of Hispanic descent.  Living in an area with limited access to: ? [place]ilda [place], recreation centers, or sidewalks. ? Healthy food choices, such as grocery stores and farmers' markets. What are the signs or symptoms? The main sign of this condition is having too much body fat. How is this diagnosed? This condition is diagnosed based on:  Your BMI. If you  are an adult with a BMI of 30 or higher, you are considered obese.  Your waist circumference. This measures the distance around your waistline.  Your skinfold thickness. Your health care provider  may gently pinch a fold of your skin and measure it. You may have other tests to check for underlying conditions. How is this treated? Treatment for this condition often includes changing your lifestyle. Treatment may include some or all of the following:  Dietary changes. This may include developing a healthy meal plan.  Regular physical activity. This may include activity that causes your heart to beat faster (aerobic exercise) and strength training. Work with your health care provider to design an exercise program that works for you.  Medicine to help you lose weight if you are unable to lose 1 pound a week after 6 weeks of healthy eating and more physical activity.  Treating conditions that cause the obesity (underlying conditions).  Surgery. Surgical options may include gastric banding and gastric bypass. Surgery may be done if: ? Other treatments have not helped to improve your condition. ? You have a BMI of 40 or higher. ? You have life-threatening health problems related to obesity. Follow these instructions at home: Eating and drinking   Follow recommendations from your health care provider about what you eat and drink. Your health care provider may advise you to: ? Limit fast food, sweets, and processed snack foods. ? Choose low-fat options, such as low-fat milk instead of whole milk. ? Eat 5 or more servings of fruits or vegetables every day. ? Eat at home more often. This gives you more control over what you eat. ? Choose healthy foods when you eat out. ? Learn to read food labels. This will help you understand how much food is considered 1 serving. ? Learn what a healthy serving size is. ? Keep low-fat snacks available. ? Limit sugary drinks, such as soda, fruit juice,  sweetened iced tea, and flavored milk.  Drink enough water to keep your urine pale yellow.  Do not follow a fad diet. Fad diets can be unhealthy and even dangerous. Physical activity  Exercise regularly, as told by your health care provider. ? Most adults should get up to 150 minutes of moderate-intensity exercise every week. ? Ask your health care provider what types of exercise are safe for you and how often you should exercise.  Warm up and stretch before being active.  Cool down and stretch after being active.  Rest between periods of activity. Lifestyle  Work with your health care provider and a dietitian to set a weight-loss goal that is healthy and reasonable for you.  Limit your screen time.  Find ways to reward yourself that do not involve food.  Do not drink alcohol if: ? Your health care provider tells you not to drink. ? You are pregnant, may be pregnant, or are planning to become pregnant.  If you drink alcohol: ? Limit how much you use to:  0-1 drink a day for women.  0-2 drinks a day for men. ? Be aware of how much alcohol is in your drink. In the [place] one drink equals one 12 oz bottle of beer (355 mL), one 5 oz glass of wine (148 mL), or one 1 oz glass of hard liquor (44 mL). General instructions  Keep a weight-loss journal to keep track of the food you eat and how much exercise you get.  Take over-the-counter and prescription medicines only as told by your health care provider.  Take vitamins and supplements only as told by your health care provider.  Consider joining a support group. Your health care provider may be able to recommend a support group.  Keep all follow-up visits as told by your health care provider. This is important. Contact a health care provider if:  You are unable to meet your weight loss goal after 6 weeks of dietary and lifestyle changes. Get help right away if you are having:  Trouble breathing.  Suicidal thoughts or  behaviors. Summary  Obesity is the condition of having too much total body fat.  Being overweight or obese means that your weight is greater than what is considered healthy for your body size.  Work with your health care provider and a dietitian to set a weight-loss goal that is healthy and reasonable for you.  Exercise regularly, as told by your health care provider. Ask your health care provider what types of exercise are safe for you and how often you should exercise. This information is not intended to replace advice given to you by your health care provider. Make sure you discuss any questions you have with your health care provider. Document Released: 08/03/2004 Document Revised: 02/28/2018 Document Reviewed: 02/28/2018 Elsevier Patient Education  2020 [place] 82-83 Years Old, Male Preventive care refers to lifestyle choices and visits with your health care provider that can promote health and wellness. This includes:  A yearly physical exam. This is also called an annual well check.  Regular dental and eye exams.  Immunizations.  Screening for certain conditions.  Healthy lifestyle choices, such as eating a healthy diet, getting regular exercise, not using drugs or products that contain nicotine and tobacco, and limiting alcohol use. What can I expect for my preventive care visit? Physical exam Your health care provider will check:  Height and weight. These may be used to calculate body mass index (BMI), which is a measurement that tells if you are at a healthy weight.  Heart rate and blood pressure.  Your skin for abnormal spots. Counseling Your health care provider may ask you questions about:  Alcohol, tobacco, and drug use.  Emotional well-being.  Home and relationship well-being.  Sexual activity.  Eating habits.  Work and work Statistician. What immunizations do I need?  Influenza (flu) vaccine  This is recommended every year.  Tetanus, diphtheria, and pertussis (Tdap) vaccine  You may need a Td booster every 10 years. Varicella (chickenpox) vaccine  You may need this vaccine if you have not already been vaccinated. Human papillomavirus (HPV) vaccine  If recommended by your health care provider, you may need three doses over 6 months. Measles, mumps, and rubella (MMR) vaccine  You may need at least one dose of MMR. You may also need a second dose. Meningococcal conjugate (MenACWY) vaccine  One dose is recommended if you are 64-58 years old and a Market researcher living in a residence hall, or if you have one of several medical conditions. You may also need additional booster doses. Pneumococcal conjugate (PCV13) vaccine  You may need this if you have certain conditions and were not previously vaccinated. Pneumococcal polysaccharide (PPSV23) vaccine  You may need one or two doses if you smoke cigarettes or if you have certain conditions. Hepatitis A vaccine  You may need this if you have certain conditions or if you travel or work in places where you may be exposed to hepatitis A. Hepatitis B vaccine  You may need this if you have certain conditions or if you travel or work in places where you may be exposed to hepatitis B. Haemophilus influenzae type b (Hib) vaccine  You may need this if you  have certain risk factors. You may receive vaccines as individual doses or as more than one vaccine together in one shot (combination vaccines). Talk with your health care provider about the risks and benefits of combination vaccines. What tests do I need? Blood tests  Lipid and cholesterol levels. These may be checked every 5 years starting at age 23.  Hepatitis C test.  Hepatitis B test. Screening   Diabetes screening. This is done by checking your blood sugar (glucose) after you have not eaten for a while (fasting).  Sexually transmitted disease (STD) testing. Talk with your health care  provider about your test results, treatment options, and if necessary, the need for more tests. Follow these instructions at home: Eating and drinking   Eat a diet that includes fresh fruits and vegetables, whole grains, lean protein, and low-fat dairy products.  Take vitamin and mineral supplements as recommended by your health care provider.  Do not drink alcohol if your health care provider tells you not to drink.  If you drink alcohol: ? Limit how much you have to 0-2 drinks a day. ? Be aware of how much alcohol is in your drink. In the [place] one drink equals one 12 oz bottle of beer (355 mL), one 5 oz glass of wine (148 mL), or one 1 oz glass of hard liquor (44 mL). Lifestyle  Take daily care of your teeth and gums.  Stay active. Exercise for at least 30 minutes on 5 or more days each week.  Do not use any products that contain nicotine or tobacco, such as cigarettes, e-cigarettes, and chewing tobacco. If you need help quitting, ask your health care provider.  If you are sexually active, practice safe sex. Use a condom or other form of protection to prevent STIs (sexually transmitted infections). What's next?  Go to your health care provider once a year for a well check visit.  Ask your health care provider how often you should have your eyes and teeth checked.  Stay up to date on all vaccines. This information is not intended to replace advice given to you by your health care provider. Make sure you discuss any questions you have with your health care provider. Document Released: 08/22/2001 Document Revised: 06/20/2018 Document Reviewed: 06/20/2018 Elsevier Patient Education  [place].  Sleep Apnea Sleep apnea is a condition in which breathing pauses or becomes shallow during sleep. Episodes of sleep apnea usually last 10 seconds or longer, and they may occur as many as 20 times an hour. Sleep apnea disrupts your sleep and keeps your body from getting the rest that  it needs. This condition can increase your risk of certain health problems, including:  Heart attack.  Stroke.  Obesity.  Diabetes.  Heart failure.  Irregular heartbeat. What are the causes? There are three kinds of sleep apnea:  Obstructive sleep apnea. This kind is caused by a blocked or collapsed airway.  Central sleep apnea. This kind happens when the part of the brain that controls breathing does not send the correct signals to the muscles that control breathing.  Mixed sleep apnea. This is a combination of obstructive and central sleep apnea. The most common cause of this condition is a collapsed or blocked airway. An airway can collapse or become blocked if:  Your throat muscles are abnormally relaxed.  Your tongue and tonsils are larger than normal.  You are overweight.  Your airway is smaller than normal. What increases the risk? You are more likely to  develop this condition if you:  Are overweight.  Smoke.  Have a smaller than normal airway.  Are elderly.  Are male.  Drink alcohol.  Take sedatives or tranquilizers.  Have a family history of sleep apnea. What are the signs or symptoms? Symptoms of this condition include:  Trouble staying asleep.  Daytime sleepiness and tiredness.  Irritability.  Loud snoring.  Morning headaches.  Trouble concentrating.  Forgetfulness.  Decreased interest in sex.  Unexplained sleepiness.  Mood swings.  Personality changes.  Feelings of depression.  Waking up often during the night to urinate.  Dry mouth.  Sore throat. How is this diagnosed? This condition may be diagnosed with:  A medical history.  A physical exam.  A series of tests that are done while you are sleeping (sleep study). These tests are usually done in a sleep lab, but they may also be done at home. How is this treated? Treatment for this condition aims to restore normal breathing and to ease symptoms during sleep. It may  involve managing health issues that can affect breathing, such as high blood pressure or obesity. Treatment may include:  Sleeping on your side.  Using a decongestant if you have nasal congestion.  Avoiding the use of depressants, including alcohol, sedatives, and narcotics.  Losing weight if you are overweight.  Making changes to your diet.  Quitting smoking.  Using a device to open your airway while you sleep, such as: ? An oral appliance. This is a custom-made mouthpiece that shifts your lower jaw forward. ? A continuous positive airway pressure (CPAP) device. This device blows air through a mask when you breathe out (exhale). ? A nasal expiratory positive airway pressure (EPAP) device. This device has valves that you put into each nostril. ? A bi-level positive airway pressure (BPAP) device. This device blows air through a mask when you breathe in (inhale) and breathe out (exhale).  Having surgery if other treatments do not work. During surgery, excess tissue is removed to create a wider airway. It is important to get treatment for sleep apnea. Without treatment, this condition can lead to:  High blood pressure.  Coronary artery disease.  In men, an inability to achieve or maintain an erection (impotence).  Reduced thinking abilities. Follow these instructions at home: Lifestyle  Make any lifestyle changes that your health care provider recommends.  Eat a healthy, well-balanced diet.  Take steps to lose weight if you are overweight.  Avoid using depressants, including alcohol, sedatives, and narcotics.  Do not use any products that contain nicotine or tobacco, such as cigarettes, e-cigarettes, and chewing tobacco. If you need help quitting, ask your health care provider. General instructions  Take over-the-counter and prescription medicines only as told by your health care provider.  If you were given a device to open your airway while you sleep, use it only as told  by your health care provider.  If you are having surgery, make sure to tell your health care provider you have sleep apnea. You may need to bring your device with you.  Keep all follow-up visits as told by your health care provider. This is important. Contact a health care provider if:  The device that you received to open your airway during sleep is uncomfortable or does not seem to be working.  Your symptoms do not improve.  Your symptoms get worse. Get help right away if:  You develop: ? Chest pain. ? Shortness of breath. ? Discomfort in your back, arms, or stomach.  You have: ? Trouble speaking. ? Weakness on one side of your body. ? Drooping in your face. These symptoms may represent a serious problem that is an emergency. Do not wait to see if the symptoms will go away. Get medical help right away. Call your local emergency services (911 in the [place] Do not drive yourself to the hospital. Summary  Sleep apnea is a condition in which breathing pauses or becomes shallow during sleep.  The most common cause is a collapsed or blocked airway.  The goal of treatment is to restore normal breathing and to ease symptoms during sleep. This information is not intended to replace advice given to you by your health care provider. Make sure you discuss any questions you have with your health care provider. Document Released: 06/16/2002 Document Revised: 04/12/2018 Document Reviewed: 02/19/2018 Elsevier Patient Education  [place].  Tobacco Use Disorder Tobacco use disorder (TUD) occurs when a person craves, seeks, and uses tobacco, regardless of the consequences. This disorder can cause problems with mental and physical health. It can affect your ability to have healthy relationships, and it can keep you from meeting your responsibilities at work, home, or school. Tobacco may be:  Smoked as a cigarette or cigar.  Inhaled using e-cigarettes.  Smoked in a pipe or hookah.   Chewed as smokeless tobacco.  Inhaled into the nostrils as snuff. Tobacco products contain a dangerous chemical called nicotine, which is very addictive. Nicotine triggers hormones that make the body feel stimulated and works on areas of the brain that make you feel good. These effects can make it hard for people to quit nicotine. Tobacco contains many other unsafe chemicals that can damage almost every organ in the body. Smoking tobacco also puts others in danger due to fire risk and possible health problems caused by breathing in secondhand smoke. What are the signs or symptoms? Symptoms of TUD may include:  Being unable to slow down or stop your tobacco use.  Spending an abnormal amount of time getting or using tobacco.  Craving tobacco. Cravings may last for up to 6 months after quitting.  Tobacco use that: ? Interferes with your work, school, or home life. ? Interferes with your personal and social relationships. ? Makes you give up activities that you once enjoyed or found important.  Using tobacco even though you know that it is: ? Dangerous or bad for your health or someone else's health. ? Causing problems in your life.  Needing more and more of the substance to get the same effect (developing tolerance).  Experiencing unpleasant symptoms if you do not use the substance (withdrawal). Withdrawal symptoms may include: ? Depressed, anxious, or irritable mood. ? Difficulty concentrating. ? Increased appetite. ? Restlessness or trouble sleeping.  Using the substance to avoid withdrawal. How is this diagnosed? This condition may be diagnosed based on:  Your current and past tobacco use. Your health care provider may ask questions about how your tobacco use affects your life.  A physical exam. You may be diagnosed with TUD if you have at least two symptoms within a 83-monthperiod. How is this treated? This condition is treated by stopping tobacco use. Many people are  unable to quit on their own and need help. Treatment may include:  Nicotine replacement therapy (NRT). NRT provides nicotine without the other harmful chemicals in tobacco. NRT gradually lowers the dosage of nicotine in the body and reduces withdrawal symptoms. NRT is available as: ? Over-the-counter gums, lozenges, and skin  patches. ? Prescription mouth inhalers and nasal sprays.  Medicine that acts on the brain to reduce cravings and withdrawal symptoms.  A type of talk therapy that examines your triggers for tobacco use, how to avoid them, and how to cope with cravings (behavioral therapy).  Hypnosis. This may help with withdrawal symptoms.  Joining a support group for others coping with TUD. The best treatment for TUD is usually a combination of medicine, talk therapy, and support groups. Recovery can be a long process. Many people start using tobacco again after stopping (relapse). If you relapse, it does not mean that treatment will not work. Follow these instructions at home:  Lifestyle  Do not use any products that contain nicotine or tobacco, such as cigarettes and e-cigarettes.  Avoid things that trigger tobacco use as much as you can. Triggers include people and situations that usually cause you to use tobacco.  Avoid drinks that contain caffeine, including coffee. These may worsen some withdrawal symptoms.  Find ways to manage stress. Wanting to smoke may cause stress, and stress can make you want to smoke. Relaxation techniques such as deep breathing, meditation, and yoga may help.  Attend support groups as needed. These groups are an important part of long-term recovery for many people. General instructions  Take over-the-counter and prescription medicines only as told by your health care provider.  Check with your health care provider before taking any new prescription or over-the-counter medicines.  Decide on a friend, family member, or smoking quit-line (such as  1-800-QUIT-NOW in the [place] that you can call or text when you feel the urge to smoke or when you need help coping with cravings.  Keep all follow-up visits as told by your health care provider and therapist. This is important. Contact a health care provider if:  You are not able to take your medicines as prescribed.  Your symptoms get worse, even with treatment. Summary  Tobacco use disorder (TUD) occurs when a person craves, seeks, and uses tobacco regardless of the consequences.  This condition may be diagnosed based on your current and past tobacco use and a physical exam.  Many people are unable to quit on their own and need help. Recovery can be a long process.  The most effective treatment for TUD is usually a combination of medicine, talk therapy, and support groups. This information is not intended to replace advice given to you by your health care provider. Make sure you discuss any questions you have with your health care provider. Document Released: 03/01/2004 Document Revised: 06/13/2017 Document Reviewed: 06/13/2017 Elsevier Patient Education  2020 [employer].

## 2019-06-17 NOTE — Progress Notes (Signed)
New Patient Office Visit  Subjective:  Patient ID: Austin Mcmahon, male    DOB: 1980/07/27  Age: 38 y.o. MRN: [id number]  CC:  Chief Complaint  Patient presents with  . Cyst    middle of back, been there for awhile, some discomfort at times    HPI Austin Mcmahon presents for establishment of care and a complete physical.  His wife is concerned about his snoring while on his back that seems to be increasing in size.  Patient is fasting this morning.  Is a Freight forwarder at [employer].  They have been busy with social distanced indoor donning and take away.  He lives with his wife and 3 children ages 52-7.  He does smoke a half a pack of cigarettes daily and drinks socially.  No illicit drug use.  No recent dedicated exercise with the shutdown but he does walk his dog up to 3 times daily.  Mom is in good health.  Father passed at age 25 from a heart attack.  He had been a heavy drinker.  Past Medical History:  Diagnosis Date  . OSA (obstructive sleep apnea)     Past Surgical History:  Procedure Laterality Date  . GANGLION CYST EXCISION     from wrist    Family History  Problem Relation Age of Onset  . Hypertension Other   . Diabetes Other   . Cancer Other     Social History   Socioeconomic History  . Marital status: Married    Spouse name: Not on file  . Number of children: Not on file  . Years of education: Not on file  . Highest education level: Not on file  Occupational History  . Not on file  Social Needs  . Financial resource strain: Not on file  . Food insecurity    Worry: Not on file    Inability: Not on file  . Transportation needs    Medical: Not on file    Non-medical: Not on file  Tobacco Use  . Smoking status: Current Every Day Smoker    Packs/day: 0.50    Types: Cigarettes  . Smokeless tobacco: Never Used  Substance and Sexual Activity  . Alcohol use: Yes    Comment: social  . Drug use: No  . Sexual activity: Not on file  Lifestyle  .  Physical activity    Days per week: Not on file    Minutes per session: Not on file  . Stress: Not on file  Relationships  . Social Herbalist on phone: Not on file    Gets together: Not on file    Attends religious service: Not on file    Active member of club or organization: Not on file    Attends meetings of clubs or organizations: Not on file    Relationship status: Not on file  . Intimate partner violence    Fear of current or ex partner: Not on file    Emotionally abused: Not on file    Physically abused: Not on file    Forced sexual activity: Not on file  Other Topics Concern  . Not on file  Social History Narrative  . Not on file    ROS Review of Systems  Constitutional: Negative for chills, diaphoresis, fatigue, fever and unexpected weight change.  HENT: Negative.   Eyes: Negative for photophobia and visual disturbance.  Respiratory: Negative for shortness of breath and wheezing.   Cardiovascular: Negative for  chest pain and palpitations.  Gastrointestinal: Negative.   Endocrine: Negative for polyphagia and polyuria.  Genitourinary: Negative for difficulty urinating, frequency and urgency.  Musculoskeletal: Negative for gait problem and joint swelling.  Skin: Negative for pallor and rash.  Allergic/Immunologic: Negative for immunocompromised state.  Neurological: Negative for light-headedness and headaches.  Hematological: Does not bruise/bleed easily.  Psychiatric/Behavioral: Negative.     Objective:   Today's Vitals: BP 118/64   Pulse 89   Temp (!) 97.3 F (36.3 C)   Ht 5' 7"  (1.702 m)   Wt 291 lb 12.8 oz (132.4 kg)   SpO2 95%   BMI 45.70 kg/m   Physical Exam Vitals signs and nursing note reviewed.  Constitutional:      General: He is not in acute distress.    Appearance: Normal appearance. He is obese. He is not ill-appearing, toxic-appearing or diaphoretic.  HENT:     Head: Normocephalic and atraumatic.     Right Ear: Tympanic  membrane, ear canal and external ear normal. There is no impacted cerumen.     Left Ear: Tympanic membrane, ear canal and external ear normal. There is no impacted cerumen.     Mouth/Throat:     Mouth: Mucous membranes are moist.     Pharynx: Oropharynx is clear. No oropharyngeal exudate or posterior oropharyngeal erythema.     Comments: Mallampati of 4 Eyes:     General: No scleral icterus.       Right eye: No discharge.        Left eye: No discharge.     Extraocular Movements: Extraocular movements intact.     Conjunctiva/sclera: Conjunctivae normal.     Pupils: Pupils are equal, round, and reactive to light.  Neck:     Musculoskeletal: Normal range of motion and neck supple. No neck rigidity or muscular tenderness.     Vascular: No carotid bruit.  Cardiovascular:     Rate and Rhythm: Normal rate and regular rhythm.  Pulmonary:     Effort: Pulmonary effort is normal.     Breath sounds: Normal breath sounds.  Abdominal:     General: Bowel sounds are normal. There is no distension.     Palpations: Abdomen is soft. There is no mass.     Tenderness: There is no abdominal tenderness. There is no guarding or rebound.     Hernia: No hernia is present. There is no hernia in the left inguinal area or right inguinal area.  Genitourinary:    Penis: Normal and circumcised. No hypospadias, erythema, tenderness, discharge, swelling or lesions.      Scrotum/Testes: Normal.        Right: Mass, tenderness or swelling not present. Right testis is descended.        Left: Mass, tenderness or swelling not present. Left testis is descended.     Epididymis:     Right: Not inflamed or enlarged.     Left: Not inflamed or enlarged.  Musculoskeletal:     Right lower leg: No edema.     Left lower leg: No edema.  Lymphadenopathy:     Cervical: No cervical adenopathy.     Lower Body: No right inguinal adenopathy. No left inguinal adenopathy.  Skin:    General: Skin is dry.       Neurological:      Mental Status: He is alert and oriented to person, place, and time.  Psychiatric:        Mood and Affect: Mood normal.  Behavior: Behavior normal.     Assessment & Plan:   Problem List Items Addressed This Visit      Respiratory   Obstructive sleep apnea syndrome   Relevant Orders   Ambulatory referral to Sleep Studies     Other   Tobacco smoker, 1 pack of cigarettes or less per day - Primary   Inclusion cyst   Relevant Orders   Ambulatory referral to General Surgery   Morbid obesity ([address])   Relevant Orders   HgB A1c   TSH    Other Visit Diagnoses    Encounter for health maintenance examination with abnormal findings       Relevant Orders   CBC   Comp Met (CMET)   Lipid Profile   HgB A1c   Urinalysis, Routine w reflex microscopic   TSH      Outpatient Encounter Medications as of 06/17/2019  Medication Sig  . acetaminophen (TYLENOL) 500 MG tablet Take 1,000 mg by mouth every 6 (six) hours as needed for mild pain.  Marland Kitchen ibuprofen (ADVIL,MOTRIN) 600 MG tablet Take 1 tablet (600 mg total) by mouth once. (Patient not taking: Reported on 06/17/2019)  . naproxen (NAPROSYN) 500 MG tablet Take 1 tablet (500 mg total) by mouth 2 (two) times daily as needed (must be taken with food). (Patient not taking: Reported on 06/17/2019)  . [DISCONTINUED] amoxicillin (AMOXIL) 500 MG capsule Take 1 capsule (500 mg total) by mouth 3 (three) times daily. (Patient not taking: Reported on 11/15/2018)  . [DISCONTINUED] famotidine (PEPCID) 20 MG tablet Take 1 tablet (20 mg total) by mouth daily. (Patient not taking: Reported on 06/17/2019)  . [DISCONTINUED] hydrOXYzine (ATARAX/VISTARIL) 25 MG tablet Take 1 tablet (25 mg total) by mouth every 6 (six) hours as needed for itching. (Patient not taking: Reported on 11/15/2018)  . [DISCONTINUED] naproxen (NAPROSYN) 500 MG tablet Take 1 tablet (500 mg total) by mouth 2 (two) times daily with a meal. (Patient not taking: Reported on 06/17/2019)  .  [DISCONTINUED] predniSONE (DELTASONE) 20 MG tablet 3 tabs po day one, then 2 po daily x 4 days (Patient not taking: Reported on 11/15/2018)  . [DISCONTINUED] traMADol (ULTRAM) 50 MG tablet Take 1 tablet (50 mg total) by mouth every 6 (six) hours as needed. (Patient not taking: Reported on 11/15/2018)   No facility-administered encounter medications on file as of 06/17/2019.     Follow-up: Return in about 6 months (around 12/16/2019), or follow up in 6 mos or sooner pendings labs.. Patient was given information on health maintenance and disease prevention.  Also given information on epidermal cyst.  Agrees to go for surgical consultation for possible removal.  He was given information on sleep apnea and agrees to come for consultation for sleep study.  Was given information on obesity and tobacco use disorder.  Given information on exercising to lose weight as well.  He declined flu vaccine today.  Libby Maw, MD

## 2019-06-18 ENCOUNTER — Ambulatory Visit: Payer: Self-pay | Admitting: Surgery

## 2019-06-18 NOTE — H&P (Signed)
Surgical H&P   CC: mass  HPI: Very nice 38 year old gentleman who presents with a mass on the mid back.  This has been present for several years and has been increasing in size.  Has not had any prior procedures for this, no prior episodes of infection or drainage.  This causes him some discomfort.  He is a Freight forwarder at [employer].  No Known Allergies  Past Medical History:  Diagnosis Date  . OSA (obstructive sleep apnea)     Past Surgical History:  Procedure Laterality Date  . GANGLION CYST EXCISION     from wrist    Family History  Problem Relation Age of Onset  . Hypertension Other   . Diabetes Other   . Cancer Other     Social History   Socioeconomic History  . Marital status: Married    Spouse name: Not on file  . Number of children: Not on file  . Years of education: Not on file  . Highest education level: Not on file  Occupational History  . Not on file  Social Needs  . Financial resource strain: Not on file  . Food insecurity    Worry: Not on file    Inability: Not on file  . Transportation needs    Medical: Not on file    Non-medical: Not on file  Tobacco Use  . Smoking status: Current Every Day Smoker    Packs/day: 0.50    Types: Cigarettes  . Smokeless tobacco: Never Used  Substance and Sexual Activity  . Alcohol use: Yes    Comment: social  . Drug use: No  . Sexual activity: Not on file  Lifestyle  . Physical activity    Days per week: Not on file    Minutes per session: Not on file  . Stress: Not on file  Relationships  . Social Herbalist on phone: Not on file    Gets together: Not on file    Attends religious service: Not on file    Active member of club or organization: Not on file    Attends meetings of clubs or organizations: Not on file    Relationship status: Not on file  Other Topics Concern  . Not on file  Social History Narrative  . Not on file    Current Outpatient Medications on File Prior to Visit   Medication Sig Dispense Refill  . acetaminophen (TYLENOL) 500 MG tablet Take 1,000 mg by mouth every 6 (six) hours as needed for mild pain.    Marland Kitchen ibuprofen (ADVIL,MOTRIN) 600 MG tablet Take 1 tablet (600 mg total) by mouth once. (Patient not taking: Reported on 06/17/2019) 30 tablet 0  . naproxen (NAPROSYN) 500 MG tablet Take 1 tablet (500 mg total) by mouth 2 (two) times daily as needed (must be taken with food). (Patient not taking: Reported on 06/17/2019) 60 tablet 0   No current facility-administered medications on file prior to visit.     Review of Systems: a complete, 10pt review of systems was completed with pertinent positives and negatives as documented in the HPI  Physical Exam: Vitals  Weight: 292.4 lb Height: 67in Body Surface Area: 2.38 m Body Mass Index: 45.8 kg/m  Temp.: 98.57F(Tympanic)  Pulse: 98 (Regular)  BP: 134/82 (Sitting, Left Arm, Standard)  Gen: alert and well appearing Eye: extraocular motion intact, no scleral icterus Neck: Trachea midline, voice normal Chest: unlabored respirations, symmetrical air entry CV: regular rate and rhythm, no pedal edema MSK:  strength symmetrical throughout, no deformity Neuro: grossly intact, normal gait Psych: normal mood and affect, appropriate insight Skin: warm and dry, there is a 4 cm mobile subcutaneous mass in the mid back just right of midline without overlying cellulitis or tenderness    CBC Latest Ref Rng & Units 06/17/2019 11/15/2018 08/14/2013  WBC 4.0 - 10.5 K/uL 5.6 5.0 7.3  Hemoglobin 13.0 - 17.0 g/dL 16.3 15.5 15.8  Hematocrit 39.0 - 52.0 % 47.3 44.1 43.4  Platelets 150.0 - 400.0 K/uL 241.0 346 249    CMP Latest Ref Rng & Units 06/17/2019 08/14/2013  Glucose 70 - 99 mg/dL 103(H) 101(H)  BUN 6 - 23 mg/dL 18 18  Creatinine 0.40 - 1.50 mg/dL 1.03 1.01  Sodium 135 - 145 mEq/L 139 137  Potassium 3.5 - 5.1 mEq/L 4.6 3.8  Chloride 96 - 112 mEq/L 105 100  CO2 19 - 32 mEq/L 26 26  Calcium 8.4 - 10.5 mg/dL  9.4 9.1  Total Protein 6.0 - 8.3 g/dL 7.0 -  Total Bilirubin 0.2 - 1.2 mg/dL 0.3 -  Alkaline Phos 39 - 117 U/L 56 -  AST 0 - 37 U/L 28 -  ALT 0 - 53 U/L 44 -    No results found for: INR, PROTIME  Imaging: No results found.   A/P: SUBCUTANEOUS MASS OF BACK (R22.2) Story: Most likely epidermal inclusion cyst. Increasing in size and symptomatic. Desires removal. Discussed the procedure in detail including risks of bleeding, infection, scarring, recurrence. Questions were answered. We'll proceed with scheduling.  Patient Active Problem List   Diagnosis Date Noted  . Class 3 severe obesity due to excess calories with body mass index (BMI) of 45.0 to 49.9 in adult [facility]) 06/17/2019  . Tobacco smoker, 1 pack of cigarettes or less per day 06/17/2019  . Obstructive sleep apnea syndrome 06/17/2019  . Inclusion cyst 06/17/2019  . Morbid obesity ([place]) 06/17/2019       Romana Juniper, MD [facility], [place]  See AMION to contact appropriate on-call provider

## 2019-08-11 ENCOUNTER — Encounter (HOSPITAL_BASED_OUTPATIENT_CLINIC_OR_DEPARTMENT_OTHER): Payer: Self-pay | Admitting: Surgery

## 2019-08-12 ENCOUNTER — Inpatient Hospital Stay (HOSPITAL_COMMUNITY): Admission: RE | Admit: 2019-08-12 | Payer: 59 | Source: Ambulatory Visit

## 2019-08-13 NOTE — Progress Notes (Signed)
Spoke with wendy at [facility] to let dr Kae Heller know pt did not take covid test 08-12-2019 and in not returning phone calls. Left emergency contact message also to call if having surgery wife isidro newswanger at [phone].

## 2019-08-15 ENCOUNTER — Ambulatory Visit (HOSPITAL_BASED_OUTPATIENT_CLINIC_OR_DEPARTMENT_OTHER): Admission: RE | Admit: 2019-08-15 | Payer: 59 | Source: Home / Self Care | Admitting: Surgery

## 2019-08-15 ENCOUNTER — Encounter (HOSPITAL_BASED_OUTPATIENT_CLINIC_OR_DEPARTMENT_OTHER): Admission: RE | Payer: Self-pay | Source: Home / Self Care

## 2019-08-15 HISTORY — DX: Localized swelling, mass and lump, trunk: R22.2

## 2019-08-15 SURGERY — EXCISION MASS
Anesthesia: Choice

## 2019-12-23 IMAGING — CR LEFT KNEE - COMPLETE 4+ VIEW
4 series · 4 of 4 positions shown · non-contrast
Comparison: None.

CLINICAL DATA: Bilateral knee pain for 3.5 weeks

EXAM:
LEFT KNEE - COMPLETE 4+ VIEW

[t knee ap left]
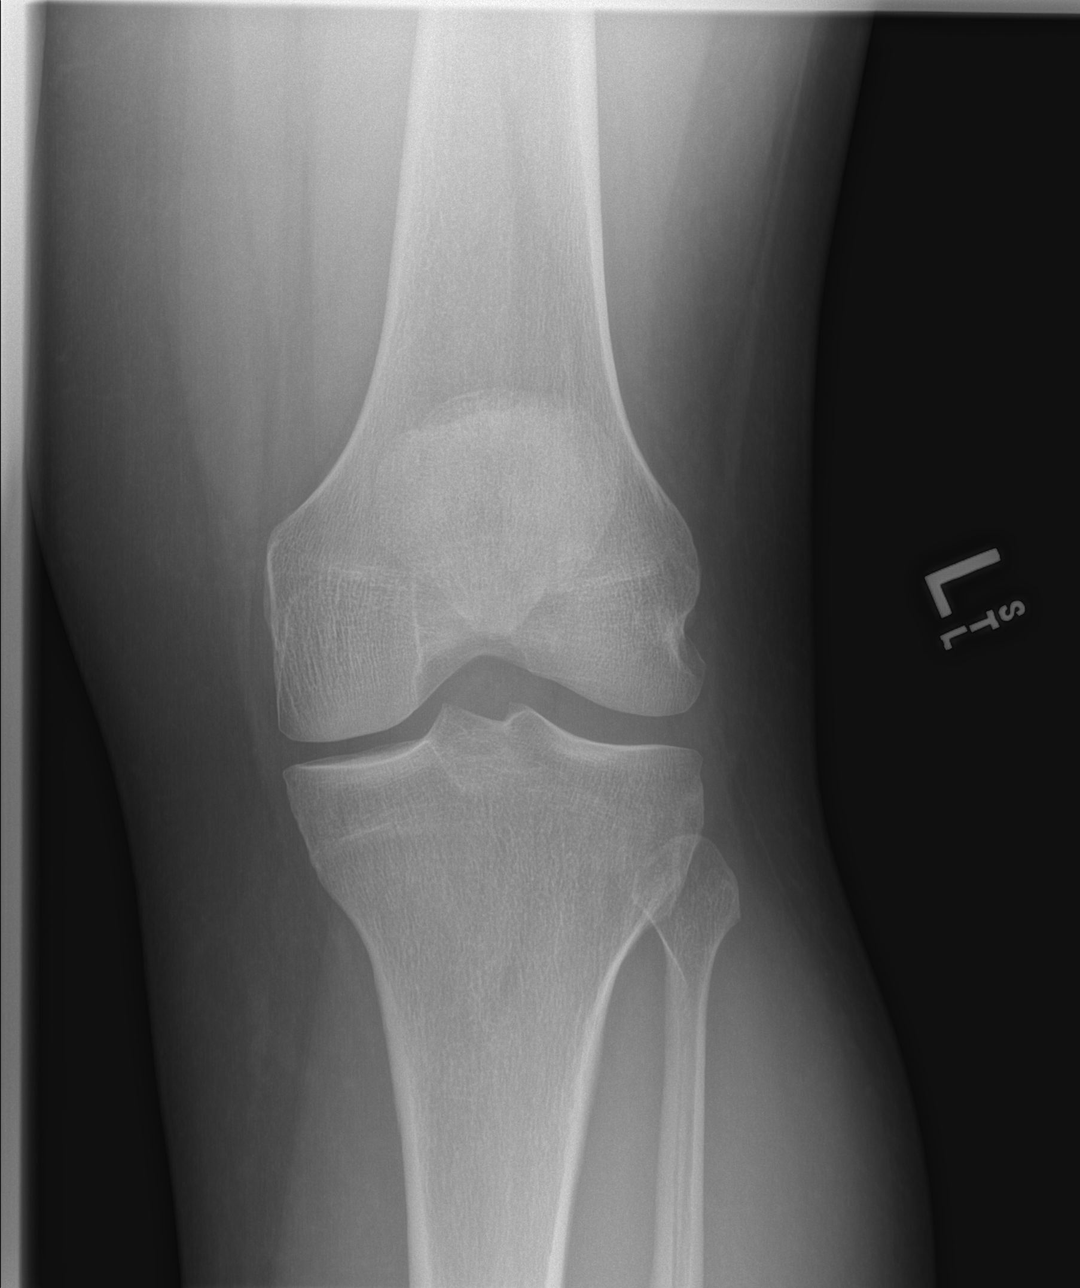

[t knee obl left (1 of 2)]
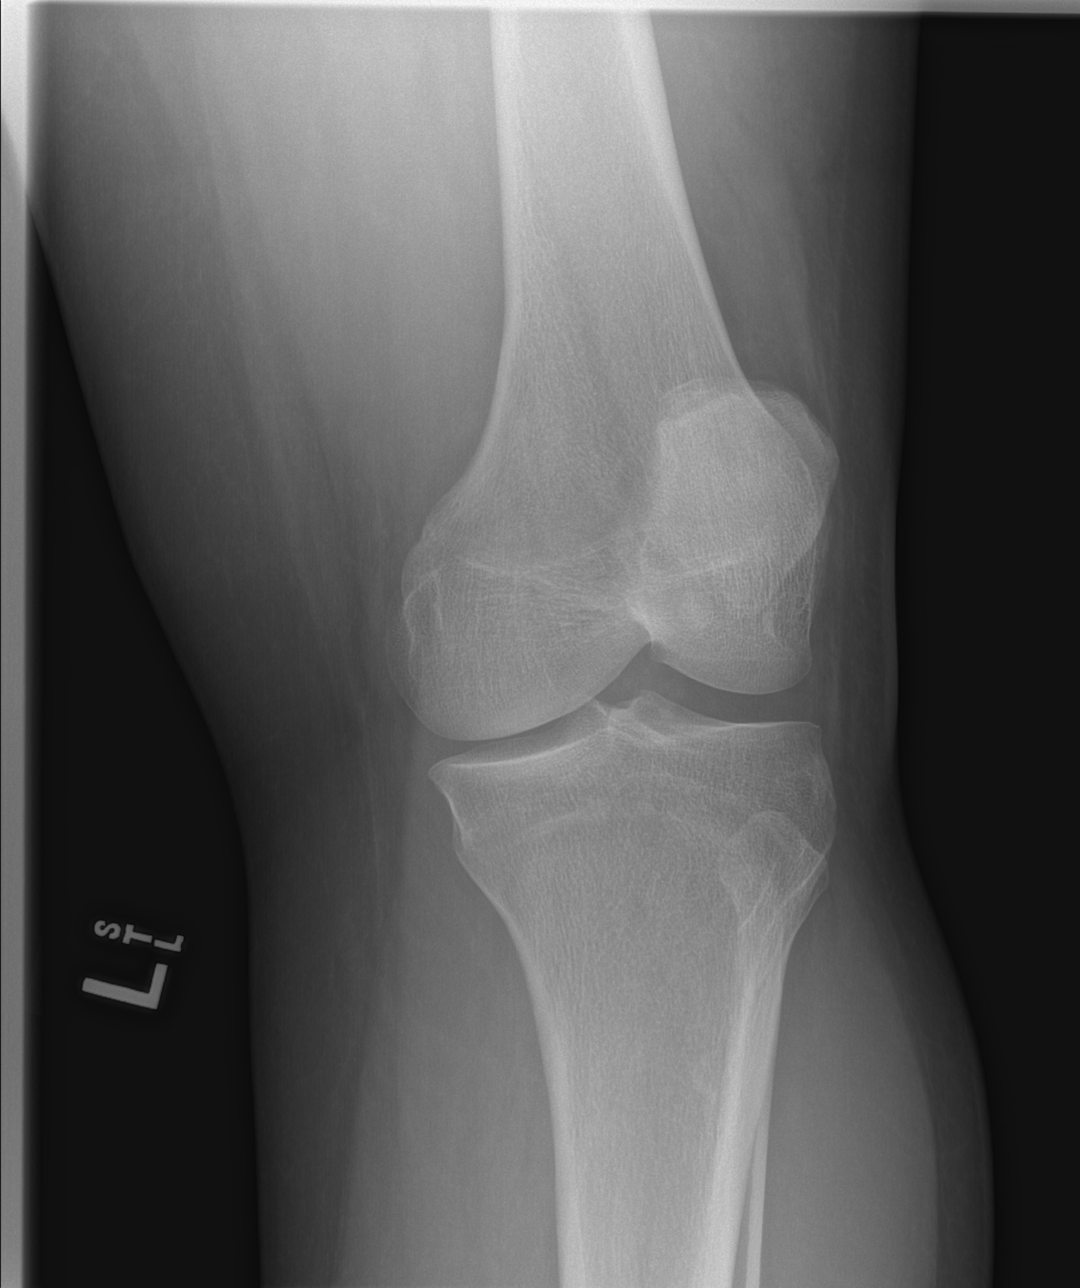

[t knee obl left (2 of 2)]
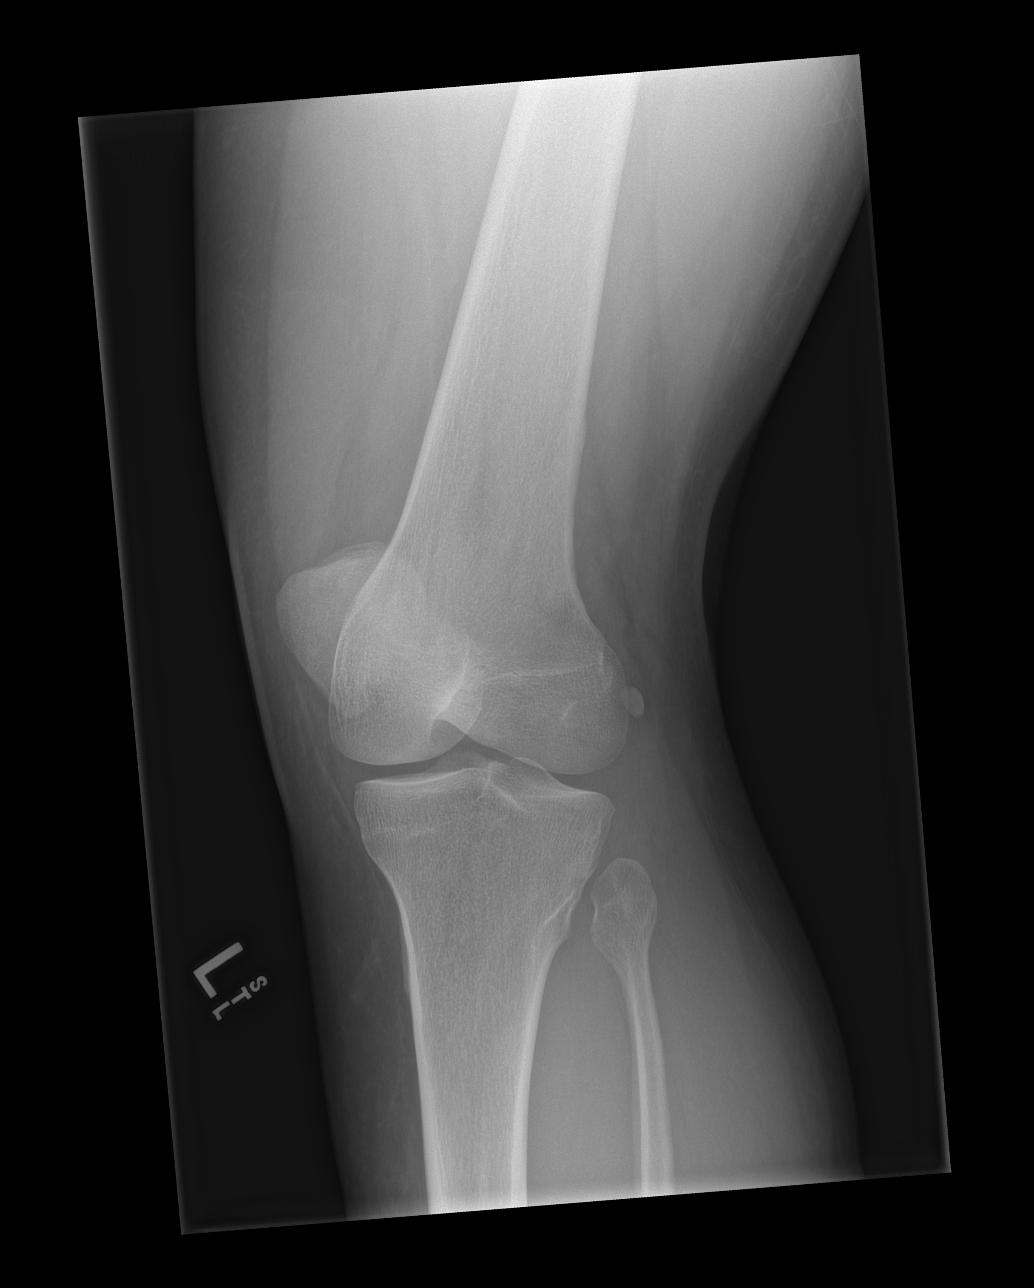

[t knee lat left]
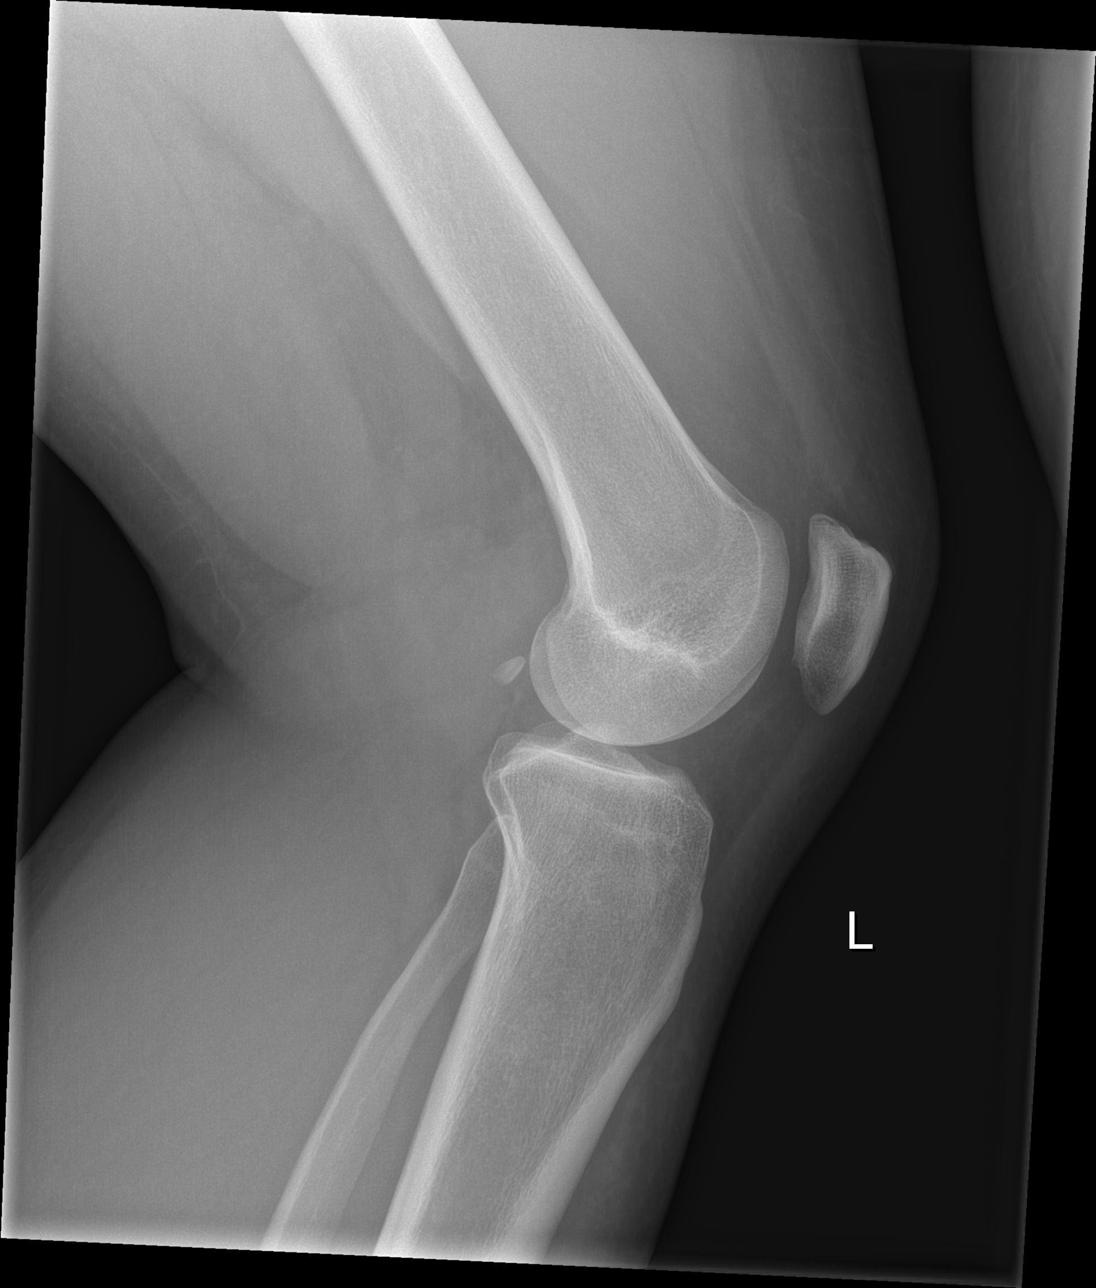

[4 of 4 positions shown; findings below may reference images not displayed]

FINDINGS: No acute fracture or dislocation. Minimal medial femorotibial
compartment joint space narrowing. No aggressive osseous lesion. No
joint effusion. Soft tissues are normal.
IMPRESSION: No acute osseous injury of the left knee.

## 2021-02-18 ENCOUNTER — Encounter (HOSPITAL_COMMUNITY): Payer: Self-pay | Admitting: *Deleted

## 2021-02-18 ENCOUNTER — Emergency Department (HOSPITAL_COMMUNITY)
Admission: EM | Admit: 2021-02-18 | Discharge: 2021-02-18 | Disposition: A | Discharge status: Home / Self Care | Payer: 59 | Attending: Emergency Medicine | Admitting: Emergency Medicine

## 2021-02-18 ENCOUNTER — Other Ambulatory Visit: Payer: Self-pay

## 2021-02-18 DIAGNOSIS — S39012A Strain of muscle, fascia and tendon of lower back, initial encounter: Secondary | ICD-10-CM

## 2021-02-18 DIAGNOSIS — X58XXXA Exposure to other specified factors, initial encounter: Secondary | ICD-10-CM | POA: Diagnosis not present

## 2021-02-18 DIAGNOSIS — S3992XA Unspecified injury of lower back, initial encounter: Secondary | ICD-10-CM | POA: Diagnosis present

## 2021-02-18 DIAGNOSIS — F17210 Nicotine dependence, cigarettes, uncomplicated: Secondary | ICD-10-CM | POA: Diagnosis not present

## 2021-02-18 MED ORDER — NAPROXEN 500 MG PO TABS: 500.0000 mg | ORAL_TABLET | Freq: Two times a day (BID) | ORAL | 0 refills | Status: DC

## 2021-02-18 MED ORDER — METHOCARBAMOL 500 MG PO TABS: 500.0000 mg | ORAL_TABLET | Freq: Every evening | ORAL | 0 refills | Status: DC | PRN

## 2021-02-18 MED ORDER — LIDOCAINE 5 % EX PTCH: 1.0000 | MEDICATED_PATCH | CUTANEOUS | 0 refills | Status: DC

## 2021-02-18 NOTE — ED Provider Notes (Signed)
[facility] DEPT Provider Note   CSN: [id number] Arrival date & time: 02/18/21  1301     History Chief Complaint  Patient presents with   Back Pain    Austin Mcmahon is a 40 y.o. male presenting for evaluation of right-sided back pain.  Patient states about an hour prior to arrival he was moving a washer using a appliance dolly going downstairs.  It started to fall, so he jerked up on it sharply.  He had acute onset right low back pain.  Since then, pain has been constant, worse when he walks and presses on the area.  He has not taken anything for it including Tylenol ibuprofen.  Nothing makes it better.  No previous history of back problems.  Does not radiate to his leg.  No loss of bowel bladder control.  No numbness or tingling.  He has no other medical problems, does not take any medications daily.  He did not fall or hit his head.  HPI     Past Medical History:  Diagnosis Date   Mass of subcutaneous tissue of back    OSA (obstructive sleep apnea)     Patient Active Problem List   Diagnosis Date Noted   Class 3 severe obesity due to excess calories with body mass index (BMI) of 45.0 to 49.9 in adult [facility]) 06/17/2019   Tobacco smoker, 1 pack of cigarettes or less per day 06/17/2019   Obstructive sleep apnea syndrome 06/17/2019   Inclusion cyst 06/17/2019   Morbid obesity ([place]) 06/17/2019    Past Surgical History:  Procedure Laterality Date   GANGLION CYST EXCISION     from wrist       Family History  Problem Relation Age of Onset   Hypertension Other    Diabetes Other    Cancer Other     Social History   Tobacco Use   Smoking status: Every Day    Packs/day: 0.50    Types: Cigarettes   Smokeless tobacco: Never  Vaping Use   Vaping Use: Never used  Substance Use Topics   Alcohol use: Yes    Comment: social   Drug use: No    Home Medications Prior to Admission medications   Medication Sig Start Date End Date  Taking? Authorizing Provider  lidocaine (LIDODERM) 5 % Place 1 patch onto the skin daily. Remove & Discard patch within 12 hours or as directed by MD 02/18/21  Yes Pinkey Mcjunkin, PA-C  methocarbamol (ROBAXIN) 500 MG tablet Take 1 tablet (500 mg total) by mouth at bedtime as needed for muscle spasms. 02/18/21  Yes Lanessa Shill, PA-C  naproxen (NAPROSYN) 500 MG tablet Take 1 tablet (500 mg total) by mouth 2 (two) times daily with a meal. 02/18/21  Yes Shashwat Cleary, PA-C  acetaminophen (TYLENOL) 500 MG tablet Take 1,000 mg by mouth every 6 (six) hours as needed for mild pain.    [provider]  ibuprofen (ADVIL,MOTRIN) 600 MG tablet Take 1 tablet (600 mg total) by mouth once. Patient not taking: Reported on 06/17/2019 02/20/15   Junius Creamer, NP    Allergies    Patient has no known allergies.  Review of Systems   Review of Systems  Musculoskeletal:  Positive for back pain.  Neurological:  Negative for numbness.   Physical Exam Updated Vital Signs BP (!) 158/120   Pulse 93   Temp 98.4 F (36.9 C) (Oral)   Resp 18   SpO2 94%   Physical Exam  Vitals and nursing note reviewed.  Constitutional:      General: He is not in acute distress.    Appearance: Normal appearance. He is obese.  HENT:     Head: Normocephalic and atraumatic.  Eyes:     Conjunctiva/sclera: Conjunctivae normal.     Pupils: Pupils are equal, round, and reactive to light.  Cardiovascular:     Rate and Rhythm: Normal rate and regular rhythm.     Pulses: Normal pulses.  Pulmonary:     Effort: Pulmonary effort is normal. No respiratory distress.     Breath sounds: Normal breath sounds. No wheezing.     Comments: Speaking in full sentences.  Clear lung sounds in all fields. Abdominal:     General: There is no distension.     Palpations: Abdomen is soft.     Tenderness: There is no abdominal tenderness.  Musculoskeletal:        General: Tenderness present. Normal range of motion.     Cervical  back: Normal range of motion and neck supple.     Comments: Tenderness palpation of right low back musculature.  No pain of the left side or midline spine.  No step-offs or deformities.  Pain does not extend into the buttock.  No numbness in the leg, no saddle anesthesia.  Pedal pulses 2+.  Skin:    General: Skin is warm and dry.     Capillary Refill: Capillary refill takes less than 2 seconds.  Neurological:     Mental Status: He is alert and oriented to person, place, and time.  Psychiatric:        Mood and Affect: Mood and affect normal.        Speech: Speech normal.        Behavior: Behavior normal.    ED Results / Procedures / Treatments   Labs (all labs ordered are listed, but only abnormal results are displayed) Labs Reviewed - No data to display  EKG None  Radiology No results found.  Procedures Procedures   Medications Ordered in ED Medications - No data to display  ED Course  I have reviewed the triage vital signs and the nursing notes.  Pertinent labs & imaging results that were available during my care of the patient were reviewed by me and considered in my medical decision making (see chart for details).    MDM Rules/Calculators/A&P                           Patient presenting for evaluation of low back pain.  Physical exam reassuring, neurovascularly intact.  No red flags for back pain.  Pain is reproducible with palpation of the musculature.  Likely musculoskeletal.  Doubt fracture, I do not believe x-rays will be beneficial.  Doubt vertebral injury, infection, spinal cord compression, myelopathy, or cauda equina syndrome.  Will treat symptomatically with NSAIDs, muscle relaxers, muscle creams.  Patient given information to follow-up with primary care and/or orthopedics.  At this time, patient appears safe for discharge.  Return precautions given.  Patient states he understands and agrees to plan.  Final Clinical Impression(s) / ED Diagnoses Final diagnoses:   Back strain, initial encounter    Rx / DC Orders ED Discharge Orders          Ordered    naproxen (NAPROSYN) 500 MG tablet  2 times daily with meals        02/18/21 1348    methocarbamol (ROBAXIN) 500 MG  tablet  At bedtime PRN        02/18/21 1348    lidocaine (LIDODERM) 5 %  Every 24 hours        02/18/21 1348             Jossilyn Benda, PA-C 02/18/21 1406    Lucrezia Starch, MD 02/21/21 [phone]

## 2021-02-18 NOTE — Discharge Instructions (Addendum)
Take naproxen 2 times a day with meals.  Do not take other anti-inflammatories at the same time (Advil, Motrin, ibuprofen, Aleve). You may supplement with Tylenol if you need further pain control. Use Robaxin as needed for muscle stiffness or soreness. Have caution, as this may make you tired or groggy. Do not drive or operate heavy machinery while taking this medication.  Use muscle creams (bengay, icy hot, salonpas) as needed for pain.  Follow up with your primary care doctor if pain is not improving with this treatment in 2 weeks.  Return to the ER if you develop high fevers, numbness, loss of bowel or bladder control, or any new or concerning symptoms.

## 2021-02-18 NOTE — ED Triage Notes (Signed)
Pt complains of back pain since moving a dryer earlier today.

## 2021-05-07 ENCOUNTER — Other Ambulatory Visit: Payer: Self-pay

## 2021-05-07 ENCOUNTER — Encounter (HOSPITAL_COMMUNITY): Payer: Self-pay

## 2021-05-07 ENCOUNTER — Emergency Department (HOSPITAL_COMMUNITY)
Admission: EM | Admit: 2021-05-07 | Discharge: 2021-05-07 | Disposition: A | Discharge status: Home / Self Care | Payer: 59 | Attending: Emergency Medicine | Admitting: Emergency Medicine

## 2021-05-07 DIAGNOSIS — R140 Abdominal distension (gaseous): Secondary | ICD-10-CM | POA: Insufficient documentation

## 2021-05-07 DIAGNOSIS — Z7984 Long term (current) use of oral hypoglycemic drugs: Secondary | ICD-10-CM | POA: Insufficient documentation

## 2021-05-07 DIAGNOSIS — R631 Polydipsia: Secondary | ICD-10-CM | POA: Diagnosis not present

## 2021-05-07 DIAGNOSIS — R739 Hyperglycemia, unspecified: Secondary | ICD-10-CM | POA: Diagnosis not present

## 2021-05-07 DIAGNOSIS — R3589 Other polyuria: Secondary | ICD-10-CM | POA: Diagnosis not present

## 2021-05-07 DIAGNOSIS — H538 Other visual disturbances: Secondary | ICD-10-CM | POA: Diagnosis not present

## 2021-05-07 DIAGNOSIS — R5383 Other fatigue: Secondary | ICD-10-CM | POA: Insufficient documentation

## 2021-05-07 DIAGNOSIS — Z87891 Personal history of nicotine dependence: Secondary | ICD-10-CM | POA: Diagnosis not present

## 2021-05-07 LAB — CBG MONITORING, ED
Glucose-Capillary: 393 mg/dL — ABNORMAL HIGH (ref 70–99)
Glucose-Capillary: 330 mg/dL — ABNORMAL HIGH (ref 70–99)
Glucose-Capillary: 297 mg/dL — ABNORMAL HIGH (ref 70–99)
Glucose-Capillary: 280 mg/dL — ABNORMAL HIGH (ref 70–99)
Glucose-Capillary: 252 mg/dL — ABNORMAL HIGH (ref 70–99)

## 2021-05-07 LAB — COMPREHENSIVE METABOLIC PANEL
ALT: 43 U/L (ref 0–44)
AST: 29 U/L (ref 15–41)
Albumin: 4.2 g/dL (ref 3.5–5.0)
Alkaline Phosphatase: 72 U/L (ref 38–126)
Anion gap: 13 (ref 5–15)
BUN: 22 mg/dL — ABNORMAL HIGH (ref 6–20)
CO2: 22 mmol/L (ref 22–32)
Calcium: 9.1 mg/dL (ref 8.9–10.3)
Chloride: 96 mmol/L — ABNORMAL LOW (ref 98–111)
Creatinine, Ser: 1.05 mg/dL (ref 0.61–1.24)
GFR, Estimated: >60 mL/min (ref >60)
Glucose, Bld: 358 mg/dL — ABNORMAL HIGH (ref 70–99)
Potassium: 3.9 mmol/L (ref 3.5–5.1)
Sodium: 131 mmol/L — ABNORMAL LOW (ref 135–145)
Total Bilirubin: 1 mg/dL (ref 0.3–1.2)
Total Protein: 8.3 g/dL — ABNORMAL HIGH (ref 6.5–8.1)

## 2021-05-07 LAB — URINALYSIS, ROUTINE W REFLEX MICROSCOPIC
Bacteria, UA: NONE SEEN
Bilirubin Urine: NEGATIVE
Glucose, UA: >=500 mg/dL — AB
Hgb urine dipstick: NEGATIVE
Ketones, ur: 80 mg/dL — AB
Leukocytes,Ua: NEGATIVE
Nitrite: NEGATIVE
Protein, ur: 30 mg/dL — AB
Specific Gravity, Urine: 1.04 — ABNORMAL HIGH (ref 1.005–1.030)
pH: 5 (ref 5.0–8.0)

## 2021-05-07 LAB — CBC WITH DIFFERENTIAL/PLATELET
Abs Immature Granulocytes: 0.09 10*3/uL — ABNORMAL HIGH (ref 0.00–0.07)
Basophils Absolute: 0.1 10*3/uL (ref 0.0–0.1)
Basophils Relative: 1 %
Eosinophils Absolute: 0.5 10*3/uL (ref 0.0–0.5)
Eosinophils Relative: 5 %
HCT: 47.7 % (ref 39.0–52.0)
Hemoglobin: 17.1 g/dL — ABNORMAL HIGH (ref 13.0–17.0)
Immature Granulocytes: 1 %
Lymphocytes Relative: 36 %
Lymphs Abs: 3.3 10*3/uL (ref 0.7–4.0)
MCH: 30 pg (ref 26.0–34.0)
MCHC: 35.8 g/dL (ref 30.0–36.0)
MCV: 83.7 fL (ref 80.0–100.0)
Monocytes Absolute: 0.8 10*3/uL (ref 0.1–1.0)
Monocytes Relative: 9 %
Neutro Abs: 4.4 10*3/uL (ref 1.7–7.7)
Neutrophils Relative %: 48 %
Platelets: 277 10*3/uL (ref 150–400)
RBC: 5.7 MIL/uL (ref 4.22–5.81)
RDW: 12.1 % (ref 11.5–15.5)
WBC: 9.2 10*3/uL (ref 4.0–10.5)
nRBC: 0 % (ref 0.0–0.2)

## 2021-05-07 LAB — BLOOD GAS, VENOUS
Acid-base deficit: 1 mmol/L (ref 0.0–2.0)
Bicarbonate: 25.1 mmol/L (ref 20.0–28.0)
O2 Saturation: 85.7 %
Patient temperature: 98.6
pCO2, Ven: 47.9 mmHg (ref 44.0–60.0)
pH, Ven: 7.339 (ref 7.250–7.430)
pO2, Ven: 55 mmHg — ABNORMAL HIGH (ref 32.0–45.0)

## 2021-05-07 LAB — LIPASE, BLOOD: Lipase: 34 U/L (ref 11–51)

## 2021-05-07 MED ORDER — METFORMIN HCL ER 500 MG PO TB24: 500.0000 mg | ORAL_TABLET | Freq: Every day | ORAL | 0 refills | Status: DC

## 2021-05-07 MED ORDER — METFORMIN HCL 500 MG PO TABS
1000.0000 mg | ORAL_TABLET | Freq: Once | ORAL | Status: AC
  Administered 2021-05-07: 1000 mg via ORAL
  Filled 2021-05-07: qty 2

## 2021-05-07 MED ORDER — SODIUM CHLORIDE 0.9 % IV BOLUS
2000.0000 mL | Freq: Once | INTRAVENOUS | Status: AC
  Administered 2021-05-07: 2000 mL via INTRAVENOUS

## 2021-05-07 MED ORDER — INSULIN ASPART 100 UNIT/ML IJ SOLN
2.0000 [IU] | Freq: Once | INTRAMUSCULAR | Status: AC
  Administered 2021-05-07: 2 [IU] via SUBCUTANEOUS
  Filled 2021-05-07: qty 0.02

## 2021-05-07 NOTE — Discharge Instructions (Addendum)
Take metformin as directed   You hemoglobin a1c is pending. You will need to follow up on this on your mychart and let your doctor know the results.  Please follow up with your primary care provider within 5-7 days for re-evaluation of your symptoms.  Please return to the emergency department for any new or worsening symptoms.

## 2021-05-07 NOTE — ED Triage Notes (Addendum)
Patient reports that he had been having urinary frequency, lethargic, and thirsty x 2 weeks. Patient went to an UC today and was referred to the ED.  Patient denies any abdominal pain or N/V.

## 2021-05-07 NOTE — ED Provider Notes (Signed)
[facility] DEPT Provider Note   CSN: [id number] Arrival date & time: 05/07/21  1145     History Chief Complaint  Patient presents with   Hyperglycemia    Austin Mcmahon is a 40 y.o. male.  HPI   40 y/o male presents to the ED today for eval of hyperglycemia. States he has had polyuria, polydipsia, blurred vision, fatigue, bloating. He was seen at urgent care pta and was noted to have elevated bs. He was sent here for further eval. He had some vomiting yesterday which is improved. Denies chest pain, sob, fevers, cough,  Past Medical History:  Diagnosis Date   Mass of subcutaneous tissue of back    OSA (obstructive sleep apnea)     Patient Active Problem List   Diagnosis Date Noted   Class 3 severe obesity due to excess calories with body mass index (BMI) of 45.0 to 49.9 in adult ([place]) 06/17/2019   Tobacco smoker, 1 pack of cigarettes or less per day 06/17/2019   Obstructive sleep apnea syndrome 06/17/2019   Inclusion cyst 06/17/2019   Morbid obesity ([place]) 06/17/2019    Past Surgical History:  Procedure Laterality Date   GANGLION CYST EXCISION     from wrist       Family History  Problem Relation Age of Onset   Hypertension Other    Diabetes Other    Cancer Other     Social History   Tobacco Use   Smoking status: Former    Packs/day: 0.50    Types: Cigarettes   Smokeless tobacco: Never  Vaping Use   Vaping Use: Never used  Substance Use Topics   Alcohol use: Yes    Comment: social   Drug use: No    Home Medications Prior to Admission medications   Medication Sig Start Date End Date Taking? Authorizing Provider  metFORMIN (GLUCOPHAGE-XR) 500 MG 24 hr tablet Take 1 tablet (500 mg total) by mouth daily. Take one tablet daily with dinner. 05/07/21  Yes Haygen Zebrowski S, PA-C  acetaminophen (TYLENOL) 500 MG tablet Take 1,000 mg by mouth every 6 (six) hours as needed for mild pain.    [provider]   ibuprofen (ADVIL,MOTRIN) 600 MG tablet Take 1 tablet (600 mg total) by mouth once. Patient not taking: Reported on 06/17/2019 02/20/15   Junius Creamer, NP  lidocaine (LIDODERM) 5 % Place 1 patch onto the skin daily. Remove & Discard patch within 12 hours or as directed by MD 02/18/21   Caccavale, Sophia, PA-C  methocarbamol (ROBAXIN) 500 MG tablet Take 1 tablet (500 mg total) by mouth at bedtime as needed for muscle spasms. 02/18/21   Caccavale, Sophia, PA-C  naproxen (NAPROSYN) 500 MG tablet Take 1 tablet (500 mg total) by mouth 2 (two) times daily with a meal. 02/18/21   Caccavale, Sophia, PA-C    Allergies    Patient has no known allergies.  Review of Systems   Review of Systems  Constitutional:  Negative for fever.  HENT:  Negative for ear pain and sore throat.   Eyes:  Negative for visual disturbance.  Respiratory:  Negative for cough and shortness of breath.   Cardiovascular:  Negative for chest pain.  Gastrointestinal:  Positive for nausea and vomiting. Negative for abdominal pain, constipation and diarrhea.  Endocrine: Positive for polydipsia, polyphagia and polyuria.  Genitourinary:  Negative for dysuria and hematuria.  Musculoskeletal:  Negative for back pain.  Skin:  Positive for color change. Negative for rash.  Neurological:  Negative for headaches.  All other systems reviewed and are negative.  Physical Exam Updated Vital Signs BP (!) 161/107   Pulse 83   Temp 98.3 F (36.8 C) (Oral)   Resp 20   Ht 5\' 7"  (1.702 m)   Wt 127.5 kg   SpO2 99%   BMI 44.01 kg/m   Physical Exam Vitals and nursing note reviewed.  Constitutional:      Appearance: He is well-developed.  HENT:     Head: Normocephalic and atraumatic.  Eyes:     Conjunctiva/sclera: Conjunctivae normal.  Cardiovascular:     Rate and Rhythm: Normal rate and regular rhythm.     Heart sounds: Normal heart sounds. No murmur heard. Pulmonary:     Effort: Pulmonary effort is normal. No respiratory distress.      Breath sounds: Normal breath sounds. No wheezing, rhonchi or rales.  Abdominal:     General: Bowel sounds are normal.     Palpations: Abdomen is soft.     Tenderness: There is no abdominal tenderness. There is no guarding or rebound.  Musculoskeletal:     Cervical back: Neck supple.  Skin:    General: Skin is warm and dry.  Neurological:     Mental Status: He is alert.    ED Results / Procedures / Treatments   Labs (all labs ordered are listed, but only abnormal results are displayed) Labs Reviewed  COMPREHENSIVE METABOLIC PANEL - Abnormal; Notable for the following components:      Result Value   Sodium 131 (*)    Chloride 96 (*)    Glucose, Bld 358 (*)    BUN 22 (*)    Total Protein 8.3 (*)    All other components within normal limits  CBC WITH DIFFERENTIAL/PLATELET - Abnormal; Notable for the following components:   Hemoglobin 17.1 (*)    Abs Immature Granulocytes 0.09 (*)    All other components within normal limits  BLOOD GAS, VENOUS - Abnormal; Notable for the following components:   pO2, Ven 55.0 (*)    All other components within normal limits  URINALYSIS, ROUTINE W REFLEX MICROSCOPIC - Abnormal; Notable for the following components:   Specific Gravity, Urine 1.040 (*)    Glucose, UA >=500 (*)    Ketones, ur 80 (*)    Protein, ur 30 (*)    All other components within normal limits  CBG MONITORING, ED - Abnormal; Notable for the following components:   Glucose-Capillary 393 (*)    All other components within normal limits  CBG MONITORING, ED - Abnormal; Notable for the following components:   Glucose-Capillary 330 (*)    All other components within normal limits  CBG MONITORING, ED - Abnormal; Notable for the following components:   Glucose-Capillary 297 (*)    All other components within normal limits  CBG MONITORING, ED - Abnormal; Notable for the following components:   Glucose-Capillary 280 (*)    All other components within normal limits  LIPASE, BLOOD   HEMOGLOBIN A1C    EKG None  Radiology No results found.  Procedures Procedures   Medications Ordered in ED Medications  sodium chloride 0.9 % bolus 2,000 mL (2,000 mLs Intravenous New Bag/Given 05/07/21 1739)  metFORMIN (GLUCOPHAGE) tablet 1,000 mg (1,000 mg Oral Given 05/07/21 1755)  insulin aspart (novoLOG) injection 2 Units (2 Units Subcutaneous Given 05/07/21 2002)    ED Course  I have reviewed the triage vital signs and the nursing notes.  Pertinent labs & imaging  results that were available during my care of the patient were reviewed by me and considered in my medical decision making (see chart for details).    MDM Rules/Calculators/A&P                          40 y/o m presents for eval of hyperglycemia  Reviewed/interpreted labs CBC is w/o leukocytosis, hgb elevated likely due to hemoconcentration  CMP with pseudohyponatremia, elevated BG, normal co2 and normal anion gap Lipase wnl VBG with no acidemia UA with glucosuria, ketonuria, and proteinuria  Pt was given IVF, insulin, and metformin. On reassessment, CBG is improving at 280 on recheck. He has no evidence of DKA on his w/u today. He is tolerating pos. Will start him on metformin. He has a pcp and I advised that he f/u in the next week for reassessment and return if worse. He voices understanding of the plan and reasons to return. All questions answered, pt stable for discharge.   Final Clinical Impression(s) / ED Diagnoses Final diagnoses:  Hyperglycemia    Rx / DC Orders ED Discharge Orders          Ordered    metFORMIN (GLUCOPHAGE-XR) 500 MG 24 hr tablet  Daily        05/07/21 2016             Rodney Booze, PA-C 05/07/21 2104    Carmin Muskrat, MD 05/07/21 2253

## 2021-05-07 NOTE — ED Provider Notes (Signed)
Emergency Medicine Provider Triage Evaluation Note  Austin Mcmahon , a 39 y.o. male  was evaluated in triage.  Pt complains of polyuria, polydipsia, blurred vision, fatigue, bloating. He was seen at urgent care pta and was noted to have elevated bs. Sent here for further eval  Review of Systems  Positive: polyuria, polydipsia, blurred vision, fatigue, bloating, nv Negative: fever  Physical Exam  BP (!) 150/102 (BP Location: Left Arm)   Pulse 78   Temp 98.3 F (36.8 C) (Oral)   Resp 20   SpO2 99%  Gen:   Awake, no distress   Resp:  Normal effort  MSK:   Moves extremities without difficulty   Medical Decision Making  Medically screening exam initiated at 12:17 PM.  Appropriate orders placed.  SHADOW SCHEDLER was informed that the remainder of the evaluation will be completed by another provider, this initial triage assessment does not replace that evaluation, and the importance of remaining in the ED until their evaluation is complete.     Rodney Booze, PA-C 05/07/21 1217    Varney Biles, MD 05/07/21 1347

## 2021-05-08 LAB — HEMOGLOBIN A1C
Hgb A1c MFr Bld: 10.8 % — ABNORMAL HIGH (ref 4.8–5.6)
Mean Plasma Glucose: 263.26 mg/dL

## 2021-05-20 ENCOUNTER — Other Ambulatory Visit: Payer: Self-pay

## 2021-05-20 ENCOUNTER — Ambulatory Visit (INDEPENDENT_AMBULATORY_CARE_PROVIDER_SITE_OTHER): Payer: 59 | Admitting: Family Medicine

## 2021-05-20 ENCOUNTER — Encounter: Payer: Self-pay | Admitting: Family Medicine

## 2021-05-20 VITALS — BP 132/80 | HR 102 | Temp 97.7°F | Ht 67.0 in | Wt 284.2 lb

## 2021-05-20 DIAGNOSIS — E1165 Type 2 diabetes mellitus with hyperglycemia: Secondary | ICD-10-CM | POA: Diagnosis not present

## 2021-05-20 DIAGNOSIS — R0683 Snoring: Secondary | ICD-10-CM | POA: Diagnosis not present

## 2021-05-20 MED ORDER — METFORMIN HCL ER 500 MG PO TB24: 500.0000 mg | ORAL_TABLET | Freq: Every day | ORAL | 1 refills | Status: DC

## 2021-05-20 NOTE — Progress Notes (Signed)
Established Patient Office Visit  Subjective:  Patient ID: Austin Mcmahon, male    DOB: [birth year]  Age: 40 y.o. MRN: [id number]  CC:  Chief Complaint  Patient presents with   Hospitalization [facility] follow up seen at ED diagnosed with diabetes.     HPI Austin Mcmahon presents for follow-up status post recent diagnosis of diabetes.  He has been a experiencing some weight loss with increased frequency of urination.  He decided to go to the emergency room.  Hemoglobin A1c was 10.8 and his blood sugar was 252.  He has been experiencing some visual changes as well.  No family history of diabetes I had seen him a couple years ago and he was concerned that his hemoglobin A1c was 5.5.  He was lost to follow-up after that time.  He is accompanied by his wife today.  He has a Freight forwarder at [employer].  He has since changed his diet and cut out as many carbohydrates as he can.  He is tolerating the given glucagon as well.  He is having no GI side effects.  He does snore.  His wife concurs that he experiences apneic episodes.  Past Medical History:  Diagnosis Date   Mass of subcutaneous tissue of back    OSA (obstructive sleep apnea)     Past Surgical History:  Procedure Laterality Date   GANGLION CYST EXCISION     from wrist    Family History  Problem Relation Age of Onset   Hypertension Other    Diabetes Other    Cancer Other     Social History   Socioeconomic History   Marital status: Married    Spouse name: Not on file   Number of children: Not on file   Years of education: Not on file   Highest education level: Not on file  Occupational History   Not on file  Tobacco Use   Smoking status: Former    Packs/day: 0.50    Types: Cigarettes   Smokeless tobacco: Never  Vaping Use   Vaping Use: Never used  Substance and Sexual Activity   Alcohol use: Yes    Comment: social   Drug use: No   Sexual activity: Not on file  Other Topics Concern    Not on file  Social History Narrative   Not on file   Social Determinants of Health   Financial Resource Strain: Not on file  Food Insecurity: Not on file  Transportation Needs: Not on file  Physical Activity: Not on file  Stress: Not on file  Social Connections: Not on file  Intimate Partner Violence: Not on file    Outpatient Medications Prior to Visit  Medication Sig Dispense Refill   acetaminophen (TYLENOL) 500 MG tablet Take 1,000 mg by mouth every 6 (six) hours as needed for mild pain.     metFORMIN (GLUCOPHAGE-XR) 500 MG 24 hr tablet Take 1 tablet (500 mg total) by mouth daily. Take one tablet daily with dinner. 30 tablet 0   lidocaine (LIDODERM) 5 % Place 1 patch onto the skin daily. Remove & Discard patch within 12 hours or as directed by MD (Patient not taking: Reported on 05/20/2021) 30 patch 0   ibuprofen (ADVIL,MOTRIN) 600 MG tablet Take 1 tablet (600 mg total) by mouth once. (Patient not taking: Reported on 06/17/2019) 30 tablet 0   methocarbamol (ROBAXIN) 500 MG tablet Take 1 tablet (500 mg total) by mouth at bedtime as needed  for muscle spasms. 14 tablet 0   naproxen (NAPROSYN) 500 MG tablet Take 1 tablet (500 mg total) by mouth 2 (two) times daily with a meal. 20 tablet 0   No facility-administered medications prior to visit.    No Known Allergies  ROS Review of Systems  Constitutional:  Positive for unexpected weight change. Negative for diaphoresis, fatigue and fever.  HENT: Negative.    Eyes:  Negative for photophobia and visual disturbance.  Respiratory: Negative.    Cardiovascular: Negative.   Gastrointestinal: Negative.   Endocrine: Positive for polyphagia and polyuria.  Genitourinary:  Positive for frequency. Negative for dysuria and urgency.  Musculoskeletal:  Negative for gait problem and joint swelling.  Neurological:  Negative for speech difficulty and weakness.  Psychiatric/Behavioral: Negative.       Objective:    Physical Exam Vitals and  nursing note reviewed.  Constitutional:      General: He is not in acute distress.    Appearance: Normal appearance. He is obese. He is not ill-appearing, toxic-appearing or diaphoretic.  HENT:     Head: Normocephalic and atraumatic.     Right Ear: Tympanic membrane, ear canal and external ear normal.     Left Ear: Tympanic membrane, ear canal and external ear normal.     Mouth/Throat:     Mouth: Mucous membranes are moist.     Pharynx: Oropharynx is clear. No oropharyngeal exudate or posterior oropharyngeal erythema.   Eyes:     General: No scleral icterus.       Right eye: No discharge.        Left eye: No discharge.     Extraocular Movements: Extraocular movements intact.     Conjunctiva/sclera: Conjunctivae normal.     Pupils: Pupils are equal, round, and reactive to light.  Neck:     Vascular: No carotid bruit.  Cardiovascular:     Rate and Rhythm: Normal rate and regular rhythm.  Pulmonary:     Effort: Pulmonary effort is normal.     Breath sounds: Normal breath sounds.  Abdominal:     General: Bowel sounds are normal.  Musculoskeletal:     Cervical back: No rigidity or tenderness.  Lymphadenopathy:     Cervical: No cervical adenopathy.  Skin:    General: Skin is warm and dry.  Neurological:     Mental Status: He is alert and oriented to person, place, and time.  Psychiatric:        Mood and Affect: Mood normal.        Behavior: Behavior normal.    BP 132/80 (BP Location: Right Arm, Patient Position: Sitting, Cuff Size: Large)   Pulse (!) 102   Temp 97.7 F (36.5 C) (Temporal)   Ht 5\' 7"  (1.702 m)   Wt 284 lb 3.2 oz (128.9 kg)   SpO2 96%   BMI 44.51 kg/m  Wt Readings from Last 3 Encounters:  05/20/21 284 lb 3.2 oz (128.9 kg)  05/07/21 281 lb (127.5 kg)  06/17/19 291 lb 12.8 oz (132.4 kg)     Health Maintenance Due  Topic Date Due   FOOT EXAM  Never done   OPHTHALMOLOGY EXAM  Never done   HIV Screening  Never done   Hepatitis C Screening  Never done    TETANUS/TDAP  Never done    There are no preventive care reminders to display for this patient.  Lab Results  Component Value Date   TSH 1.51 06/17/2019   Lab Results  Component Value Date  WBC 9.2 05/07/2021   HGB 17.1 (H) 05/07/2021   HCT 47.7 05/07/2021   MCV 83.7 05/07/2021   PLT 277 05/07/2021   Lab Results  Component Value Date   NA 131 (L) 05/07/2021   K 3.9 05/07/2021   CO2 22 05/07/2021   GLUCOSE 358 (H) 05/07/2021   BUN 22 (H) 05/07/2021   CREATININE 1.05 05/07/2021   BILITOT 1.0 05/07/2021   ALKPHOS 72 05/07/2021   AST 29 05/07/2021   ALT 43 05/07/2021   PROT 8.3 (H) 05/07/2021   ALBUMIN 4.2 05/07/2021   CALCIUM 9.1 05/07/2021   ANIONGAP 13 05/07/2021   GFR 98.11 06/17/2019   Lab Results  Component Value Date   CHOL 202 (H) 06/17/2019   Lab Results  Component Value Date   HDL 31.30 (L) 06/17/2019   Lab Results  Component Value Date   LDLCALC 148 (H) 06/17/2019   Lab Results  Component Value Date   TRIG 117.0 06/17/2019   Lab Results  Component Value Date   CHOLHDL 6 06/17/2019   Lab Results  Component Value Date   HGBA1C 10.8 (H) 05/07/2021      Assessment & Plan:   Problem List Items Addressed This Visit       Endocrine   Type 2 diabetes mellitus with hyperglycemia, without long-term current use of insulin (HCC) - Primary   Relevant Medications   metFORMIN (GLUCOPHAGE XR) 500 MG 24 hr tablet   Other Relevant Orders   Ambulatory referral to diabetic education   Basic metabolic panel     Other   Snores   Relevant Orders   Ambulatory referral to Sleep Studies    Meds ordered this encounter  Medications   metFORMIN (GLUCOPHAGE XR) 500 MG 24 hr tablet    Sig: Take 1 tablet (500 mg total) by mouth daily with breakfast.    Dispense:  180 tablet    Refill:  1    Follow-up: Return Has physical with me next month..  Given information on diabetes basics and standard care.  Also given information on sleep apnea.  Agrees to  go for sleep study and diabetic teaching.  Libby Maw, MD

## 2021-06-22 ENCOUNTER — Other Ambulatory Visit: Payer: Self-pay

## 2021-06-23 ENCOUNTER — Encounter: Payer: Self-pay | Admitting: Family Medicine

## 2021-06-23 ENCOUNTER — Ambulatory Visit (INDEPENDENT_AMBULATORY_CARE_PROVIDER_SITE_OTHER): Payer: 59 | Admitting: Family Medicine

## 2021-06-23 VITALS — BP 136/94 | HR 87 | Temp 97.7°F | Ht 67.0 in | Wt 283.0 lb

## 2021-06-23 DIAGNOSIS — E1165 Type 2 diabetes mellitus with hyperglycemia: Secondary | ICD-10-CM | POA: Diagnosis not present

## 2021-06-23 DIAGNOSIS — R030 Elevated blood-pressure reading, without diagnosis of hypertension: Secondary | ICD-10-CM

## 2021-06-23 DIAGNOSIS — Z0000 Encounter for general adult medical examination without abnormal findings: Secondary | ICD-10-CM

## 2021-06-23 DIAGNOSIS — R0683 Snoring: Secondary | ICD-10-CM

## 2021-06-23 DIAGNOSIS — Z6841 Body Mass Index (BMI) 40.0 and over, adult: Secondary | ICD-10-CM

## 2021-06-23 DIAGNOSIS — D171 Benign lipomatous neoplasm of skin and subcutaneous tissue of trunk: Secondary | ICD-10-CM | POA: Insufficient documentation

## 2021-06-23 LAB — URINALYSIS, ROUTINE W REFLEX MICROSCOPIC
Hgb urine dipstick: NEGATIVE
Ketones, ur: NEGATIVE
Leukocytes,Ua: NEGATIVE
Nitrite: NEGATIVE
RBC / HPF: NONE SEEN (ref 0–?)
Specific Gravity, Urine: 1.025 (ref 1.000–1.030)
Total Protein, Urine: NEGATIVE
Urine Glucose: NEGATIVE
Urobilinogen, UA: 0.2 (ref 0.0–1.0)
pH: 6 (ref 5.0–8.0)

## 2021-06-23 LAB — MICROALBUMIN / CREATININE URINE RATIO
Creatinine,U: 438 mg/dL
Microalb Creat Ratio: 0.6 mg/g (ref 0.0–30.0)
Microalb, Ur: 2.7 mg/dL — ABNORMAL HIGH (ref 0.0–1.9)

## 2021-06-23 LAB — COMPREHENSIVE METABOLIC PANEL
ALT: 33 U/L (ref 0–53)
AST: 24 U/L (ref 0–37)
Albumin: 4.3 g/dL (ref 3.5–5.2)
Alkaline Phosphatase: 42 U/L (ref 39–117)
BUN: 16 mg/dL (ref 6–23)
CO2: 26 mEq/L (ref 19–32)
Calcium: 9.5 mg/dL (ref 8.4–10.5)
Chloride: 104 mEq/L (ref 96–112)
Creatinine, Ser: 1.02 mg/dL (ref 0.40–1.50)
GFR: 92.64 mL/min (ref >60.00)
Glucose, Bld: 109 mg/dL — ABNORMAL HIGH (ref 70–99)
Potassium: 3.8 mEq/L (ref 3.5–5.1)
Sodium: 137 mEq/L (ref 135–145)
Total Bilirubin: 0.5 mg/dL (ref 0.2–1.2)
Total Protein: 7.6 g/dL (ref 6.0–8.3)

## 2021-06-23 LAB — LIPID PANEL
Cholesterol: 191 mg/dL (ref 0–200)
HDL: 29.5 mg/dL — ABNORMAL LOW (ref >39.00)
LDL Cholesterol: 141 mg/dL — ABNORMAL HIGH (ref 0–99)
NonHDL: 161.54
Total CHOL/HDL Ratio: 6
Triglycerides: 103 mg/dL (ref 0.0–149.0)
VLDL: 20.6 mg/dL (ref 0.0–40.0)

## 2021-06-23 LAB — LDL CHOLESTEROL, DIRECT: Direct LDL: 160 mg/dL

## 2021-06-23 LAB — CBC
HCT: 46.1 % (ref 39.0–52.0)
Hemoglobin: 15.9 g/dL (ref 13.0–17.0)
MCHC: 34.5 g/dL (ref 30.0–36.0)
MCV: 87.9 fl (ref 78.0–100.0)
Platelets: 291 10*3/uL (ref 150.0–400.0)
RBC: 5.25 Mil/uL (ref 4.22–5.81)
RDW: 13.9 % (ref 11.5–15.5)
WBC: 4.5 10*3/uL (ref 4.0–10.5)

## 2021-06-23 NOTE — Progress Notes (Signed)
Established Patient Office Visit  Subjective:  Patient ID: Austin Mcmahon, male    DOB: 1981/02/24  Age: 40 y.o. MRN: [id number]  CC:  Chief Complaint  Patient presents with   Annual Exam    CPE.    HPI [name] presents for a physical exam he is fasting.  Feels better having been on the metformin for a month or so energy levels are increasing.  Continues to be mindful of carbohydrate intake.  No longer experiencing urinary frequency.  Never heard about an appointment for sleep study.  He is scheduled to see the nutritionist.  Past Medical History:  Diagnosis Date   Mass of subcutaneous tissue of back    OSA (obstructive sleep apnea)     Past Surgical History:  Procedure Laterality Date   GANGLION CYST EXCISION     from wrist    Family History  Problem Relation Age of Onset   Hypertension Other    Diabetes Other    Cancer Other     Social History   Socioeconomic History   Marital status: Married    Spouse name: Not on file   Number of children: Not on file   Years of education: Not on file   Highest education level: Not on file  Occupational History   Not on file  Tobacco Use   Smoking status: Former    Packs/day: 0.50    Types: Cigarettes   Smokeless tobacco: Never  Vaping Use   Vaping Use: Never used  Substance and Sexual Activity   Alcohol use: Yes    Comment: social   Drug use: No   Sexual activity: Not on file  Other Topics Concern   Not on file  Social History Narrative   Not on file   Social Determinants of Health   Financial Resource Strain: Not on file  Food Insecurity: Not on file  Transportation Needs: Not on file  Physical Activity: Not on file  Stress: Not on file  Social Connections: Not on file  Intimate Partner Violence: Not on file    Outpatient Medications Prior to Visit  Medication Sig Dispense Refill   acetaminophen (TYLENOL) 500 MG tablet Take 1,000 mg by mouth every 6 (six) hours as needed for mild pain.      lidocaine (LIDODERM) 5 % Place 1 patch onto the skin daily. Remove & Discard patch within 12 hours or as directed by MD (Patient not taking: Reported on 05/20/2021) 30 patch 0   metFORMIN (GLUCOPHAGE XR) 500 MG 24 hr tablet Take 1 tablet (500 mg total) by mouth daily with breakfast. 180 tablet 1   No facility-administered medications prior to visit.    No Known Allergies  ROS Review of Systems  Constitutional:  Negative for chills, diaphoresis, fatigue, fever and unexpected weight change.  HENT: Negative.    Eyes:  Negative for photophobia and visual disturbance.  Respiratory: Negative.    Cardiovascular: Negative.   Gastrointestinal: Negative.   Endocrine: Negative for polyphagia and polyuria.  Genitourinary:  Negative for difficulty urinating, frequency and urgency.  Musculoskeletal:  Negative for gait problem and joint swelling.  Neurological:  Negative for speech difficulty and weakness.  Hematological:  Does not bruise/bleed easily.     Depression screen [facility] 2/9 05/20/2021 06/17/2019  Decreased Interest 0 0  Down, Depressed, Hopeless 0 0  PHQ - 2 Score 0 0     Objective:    Physical Exam Vitals and nursing note reviewed.  Constitutional:  General: He is not in acute distress.    Appearance: Normal appearance. He is obese. He is not ill-appearing, toxic-appearing or diaphoretic.  HENT:     Head: Normocephalic and atraumatic.     Right Ear: Tympanic membrane, ear canal and external ear normal.     Left Ear: Tympanic membrane, ear canal and external ear normal.     Mouth/Throat:     Mouth: Mucous membranes are moist.     Pharynx: Oropharynx is clear. No oropharyngeal exudate or posterior oropharyngeal erythema.  Eyes:     General: No scleral icterus.       Right eye: No discharge.        Left eye: No discharge.     Extraocular Movements: Extraocular movements intact.     Conjunctiva/sclera: Conjunctivae normal.     Pupils: Pupils are equal, round, and  reactive to light.  Neck:     Vascular: No carotid bruit.  Cardiovascular:     Rate and Rhythm: Normal rate and regular rhythm.  Pulmonary:     Effort: Pulmonary effort is normal.     Breath sounds: Normal breath sounds.  Abdominal:     General: Abdomen is flat. Bowel sounds are normal. There is no distension.     Palpations: Abdomen is soft. There is no mass.     Tenderness: There is no abdominal tenderness. There is no guarding or rebound.     Hernia: No hernia is present.  Genitourinary:    Penis: Normal and circumcised. No hypospadias, erythema, tenderness, discharge, swelling or lesions.      Testes:        Right: Mass, tenderness or swelling not present. Right testis is descended.        Left: Mass, tenderness or swelling not present. Left testis is descended.     Epididymis:     Right: Not inflamed or enlarged.     Left: Not inflamed or enlarged.  Musculoskeletal:     Cervical back: No rigidity or tenderness.  Lymphadenopathy:     Cervical: No cervical adenopathy.  Skin:    General: Skin is warm and dry.  Neurological:     Mental Status: He is alert and oriented to person, place, and time.  Psychiatric:        Mood and Affect: Mood normal.        Behavior: Behavior normal.    BP (!) 136/94 (BP Location: Right Arm, Patient Position: Sitting, Cuff Size: Large)    Pulse 87    Temp 97.7 F (36.5 C) (Temporal)    Ht 5\' 7"  (1.702 m)    Wt 283 lb (128.4 kg)    SpO2 96%    BMI 44.32 kg/m  Wt Readings from Last 3 Encounters:  06/23/21 283 lb (128.4 kg)  05/20/21 284 lb 3.2 oz (128.9 kg)  05/07/21 281 lb (127.5 kg)     Health Maintenance Due  Topic Date Due   Pneumococcal Vaccine 31-21 Years old (1 - PCV) Never done   FOOT EXAM  Never done   OPHTHALMOLOGY EXAM  Never done   URINE MICROALBUMIN  Never done   HIV Screening  Never done   Hepatitis C Screening  Never done   TETANUS/TDAP  Never done    There are no preventive care reminders to display for this  patient.  Lab Results  Component Value Date   TSH 1.51 06/17/2019   Lab Results  Component Value Date   WBC 9.2 05/07/2021   HGB 17.1 (  H) 05/07/2021   HCT 47.7 05/07/2021   MCV 83.7 05/07/2021   PLT 277 05/07/2021   Lab Results  Component Value Date   NA 131 (L) 05/07/2021   K 3.9 05/07/2021   CO2 22 05/07/2021   GLUCOSE 358 (H) 05/07/2021   BUN 22 (H) 05/07/2021   CREATININE 1.05 05/07/2021   BILITOT 1.0 05/07/2021   ALKPHOS 72 05/07/2021   AST 29 05/07/2021   ALT 43 05/07/2021   PROT 8.3 (H) 05/07/2021   ALBUMIN 4.2 05/07/2021   CALCIUM 9.1 05/07/2021   ANIONGAP 13 05/07/2021   GFR 98.11 06/17/2019   Lab Results  Component Value Date   CHOL 202 (H) 06/17/2019   Lab Results  Component Value Date   HDL 31.30 (L) 06/17/2019   Lab Results  Component Value Date   LDLCALC 148 (H) 06/17/2019   Lab Results  Component Value Date   TRIG 117.0 06/17/2019   Lab Results  Component Value Date   CHOLHDL 6 06/17/2019   Lab Results  Component Value Date   HGBA1C 10.8 (H) 05/07/2021      Assessment & Plan:   Problem List Items Addressed This Visit       Endocrine   Type 2 diabetes mellitus with hyperglycemia, without long-term current use of insulin ([place])   Relevant Orders   Ambulatory referral to Ophthalmology   Comprehensive metabolic panel   Urinalysis, Routine w reflex microscopic   Microalbumin / creatinine urine ratio     Other   Class 3 severe obesity due to excess calories with body mass index (BMI) of 45.0 to 49.9 in adult ([place])   Snores - Primary   Relevant Orders   Ambulatory referral to Sleep Studies   Healthcare maintenance   Relevant Orders   CBC   Comprehensive metabolic panel   Lipid panel   LDL cholesterol, direct   Urinalysis, Routine w reflex microscopic   Lipoma of torso   Elevated blood pressure reading    No orders of the defined types were placed in this encounter.   Follow-up: Return in about 3 months (around  09/21/2021), or Try to lose weight and exercise for 30 minutes daily..  Information was given on health maintenance and disease prevention.  Also was given information on BMI.  Will reorder sleep study.  We will go for eye check to ophthalmology.  Information was given on lipoma.  We discussed surgical referral if the tumor on the back changes, becomes painful bothersome.  Advised exercise and weight loss.  Scheduled for diabetic teaching soon.    Libby Maw, MD

## 2021-08-01 ENCOUNTER — Encounter: Payer: 59 | Attending: Family Medicine | Admitting: Dietician

## 2021-09-06 LAB — HM DIABETES EYE EXAM

## 2021-09-07 ENCOUNTER — Encounter: Payer: Self-pay | Admitting: Family Medicine

## 2021-09-14 ENCOUNTER — Ambulatory Visit (INDEPENDENT_AMBULATORY_CARE_PROVIDER_SITE_OTHER): Payer: 59 | Admitting: Neurology

## 2021-09-14 ENCOUNTER — Encounter: Payer: Self-pay | Admitting: Neurology

## 2021-09-14 VITALS — BP 139/95 | HR 74 | Ht 67.0 in | Wt 281.0 lb

## 2021-09-14 DIAGNOSIS — R0683 Snoring: Secondary | ICD-10-CM

## 2021-09-14 DIAGNOSIS — R0681 Apnea, not elsewhere classified: Secondary | ICD-10-CM

## 2021-09-14 DIAGNOSIS — G4719 Other hypersomnia: Secondary | ICD-10-CM | POA: Diagnosis not present

## 2021-09-14 DIAGNOSIS — G478 Other sleep disorders: Secondary | ICD-10-CM

## 2021-09-14 DIAGNOSIS — R030 Elevated blood-pressure reading, without diagnosis of hypertension: Secondary | ICD-10-CM

## 2021-09-14 NOTE — Patient Instructions (Signed)

## 2021-09-14 NOTE — Progress Notes (Signed)
Subjective:    Patient ID: Austin Mcmahon is a 41 y.o. male.  HPI    Star Age, MD, PhD [facility] [address] [address] [place], [place] [address]  Dear Dr. Ethelene Hal,   I saw your patient, Austin Mcmahon, upon your kind request in my sleep clinic today for initial consultation of his sleep disorder, in particular, concern for underlying obstructive sleep apnea.  The patient is unaccompanied today.  As you know, [name] is a 42 year old right-handed gentleman with an underlying history of recent diagnosis of diabetes, elevated blood pressure values, and severe obesity with a BMI of over 40, who reports snoring and excessive daytime somnolence, as well as witnessed apneas per wife's report.  Looking back, he admits that symptoms have been ongoing for 5 to 7 years now.  He has woken up with a sense of gasping for air.  He is now working on weight loss, since his diabetes diagnosis he has been exercising on a regular basis and has eliminated practically all caffeine.  He used to drink quite a bit of soda and energy drinks before. I reviewed your office notes from 05/20/2021 as well as 06/23/2021.  His Epworth sleepiness score is 15 out of 24, fatigue severity score is 31 out of 63.  He lives with his family including wife and 3 children, ages 15, 65 and 23.  He works as a Scientist, clinical (histocompatibility and immunogenetics) and his work schedule is variable.  He is in bed typically around 2 AM and they do have a TV on in the bedroom, sometimes it stays on all night and sometimes it turns off on a timer, his rise time is around 7 AM for work.  He quit smoking in July 2022.  He has no obvious family history of sleep apnea.  He drinks alcohol rarely.  They have 1 dog in the household.   His Past Medical History Is Significant For: Past Medical History:  Diagnosis Date   Mass of subcutaneous tissue of back    OSA (obstructive sleep apnea)     His Past Surgical History Is Significant  For: Past Surgical History:  Procedure Laterality Date   GANGLION CYST EXCISION     from wrist    His Family History Is Significant For: Family History  Problem Relation Age of Onset   Hypertension Other    Diabetes Other    Cancer Other     His Social History Is Significant For: Social History   Socioeconomic History   Marital status: Married    Spouse name: Not on file   Number of children: Not on file   Years of education: Not on file   Highest education level: Not on file  Occupational History   Not on file  Tobacco Use   Smoking status: Former    Packs/day: 0.50    Types: Cigarettes   Smokeless tobacco: Never  Vaping Use   Vaping Use: Never used  Substance and Sexual Activity   Alcohol use: Yes    Comment: social "very little"   Drug use: No   Sexual activity: Not on file  Other Topics Concern   Not on file  Social History Narrative   Lives at home with spouse and 3 children   Right handed   Caffeine: none    Social Determinants of Health   Financial Resource Strain: Not on file  Food Insecurity: Not on file  Transportation Needs: Not on file  Physical Activity: Not  on file  Stress: Not on file  Social Connections: Not on file    His Allergies Are:  No Known Allergies:   His Current Medications Are:  Outpatient Encounter Medications as of 09/14/2021  Medication Sig   acetaminophen (TYLENOL) 500 MG tablet Take 1,000 mg by mouth every 6 (six) hours as needed for mild pain.   metFORMIN (GLUCOPHAGE XR) 500 MG 24 hr tablet Take 1 tablet (500 mg total) by mouth daily with breakfast.   lidocaine (LIDODERM) 5 % Place 1 patch onto the skin daily. Remove & Discard patch within 12 hours or as directed by MD (Patient not taking: Reported on 09/14/2021)   No facility-administered encounter medications on file as of 09/14/2021.  :   Review of Systems:  Out of a complete 14 point review of systems, all are reviewed and negative with the exception of these  symptoms as listed below:  Review of Systems  Neurological:        Patient is here alone for a sleep consult. He endorses snoring and his wife has noted him to stop breathing in his sleep. He has even woke up catching his breath at times. Pt states he has daytime sleepiness. Never feels rested.   Objective:  Neurological Exam  Physical Exam Physical Examination:   Vitals:   09/14/21 1229  BP: (!) 139/95  Pulse: 74    General Examination: The patient is a very pleasant 41 y.o. male in no acute distress. He appears well-developed and well-nourished and well groomed.   HEENT: Normocephalic, atraumatic, pupils are equal, round and reactive to light, extraocular tracking is good without limitation to gaze excursion or nystagmus noted. Hearing is grossly intact. Face is symmetric with normal facial animation. Speech is clear with no dysarthria noted. There is no hypophonia. There is no lip, neck/head, jaw or voice tremor. Neck is supple with full range of passive and active motion. There are no carotid bruits on auscultation. Oropharynx exam reveals: mild mouth dryness, good dental hygiene and marked airway crowding, due to small airway entry, thicker soft palate, prominent uvula and tonsillar size of about 2+.  Mallampati class III.  Neck circumference of 20-1/2 inches, minimal overbite noted.  Tongue protrudes centrally and palate elevates symmetrically, nasal inspection reveals somewhat crowded nasal passages secondary to prominent inferior turbinates bilaterally.  No obvious septal deviation.  Chest: Clear to auscultation without wheezing, rhonchi or crackles noted.  Heart: S1+S2+0, regular and normal without murmurs, rubs or gallops noted.   Abdomen: Soft, non-tender and non-distended.  Extremities: There is no obvious edema.   Skin: Warm and dry without trophic changes noted.   Musculoskeletal: exam reveals no obvious joint deformities.   Neurologically:  Mental status: The patient  is awake, alert and oriented in all 4 spheres. His immediate and remote memory, attention, language skills and fund of knowledge are appropriate. There is no evidence of aphasia, agnosia, apraxia or anomia. Speech is clear with normal prosody and enunciation. Thought process is linear. Mood is normal and affect is normal.  Cranial nerves II - XII are as described above under HEENT exam.  Motor exam: Normal bulk, strength and tone is noted. There is no tremor. Fine motor skills and coordination: grossly intact.  Cerebellar testing: No dysmetria or intention tremor. There is no truncal or gait ataxia.  Sensory exam: intact to light touch in the upper and lower extremities.  Gait, station and balance: He stands without difficulty. No veering to one side is noted. No leaning  to one side is noted. Posture is age-appropriate and stance is narrow based. Gait shows normal stride length and normal pace. No problems turning are noted.   Assessment and Plan:   In summary, MONTEZ CUDA is a very pleasant 41 y.o.-year old male with an underlying history of recent diagnosis of diabetes, elevated blood pressure values, and severe obesity with a BMI of over 40, whose history and physical exam are concerning for obstructive sleep apnea (OSA). I had a long chat with the patient about my findings and the diagnosis of OSA, its prognosis and treatment options. We talked about medical treatments, surgical interventions and non-pharmacological approaches. I explained in particular the risks and ramifications of untreated moderate to severe OSA, especially with respect to developing cardiovascular disease down the Road, including congestive heart failure, difficult to treat hypertension, cardiac arrhythmias, or stroke. Even type 2 diabetes has, in part, been linked to untreated OSA. Symptoms of untreated OSA include daytime sleepiness, memory problems, mood irritability and mood disorder such as depression and anxiety, lack  of energy, as well as recurrent headaches, especially morning headaches. We talked about trying to maintain a healthy lifestyle in general, as well as the importance of weight control. We also talked about the importance of good sleep hygiene. I recommended the following at this time: sleep study.  I outlined the differences between a laboratory attended sleep study versus home sleep test. I explained the sleep test procedure to the patient and also outlined possible surgical and non-surgical treatment options of OSA, including the use of a custom-made dental device (which would require a referral to a specialist dentist or oral surgeon), upper airway surgical options, such as traditional UPPP or a novel less invasive surgical option in the form of Inspire hypoglossal nerve stimulation (which would involve a referral to an ENT surgeon). I also explained the CPAP treatment option to the patient, who indicated that he would be willing to try CPAP if the need arises.  We will pick up our discussion after testing, we will call him with his test results and email him through [place] and proceed from there.  We will plan a follow-up in sleep clinic accordingly. I answered all his questions today and the patient was in agreement. I plan to see him back after the sleep study is completed and encouraged him to call with any interim questions, concerns, problems or updates.   Thank you very much for allowing me to participate in the care of this nice patient. If I can be of any further assistance to you please do not hesitate to call me at [phone].  Sincerely,   Star Age, MD, PhD

## 2021-09-23 ENCOUNTER — Ambulatory Visit: Payer: 59 | Admitting: Family Medicine

## 2021-09-26 ENCOUNTER — Encounter: Payer: Self-pay | Admitting: Family Medicine

## 2021-09-26 ENCOUNTER — Other Ambulatory Visit: Payer: Self-pay

## 2021-09-26 ENCOUNTER — Ambulatory Visit (INDEPENDENT_AMBULATORY_CARE_PROVIDER_SITE_OTHER): Payer: 59 | Admitting: Family Medicine

## 2021-09-26 VITALS — BP 160/90 | HR 81 | Temp 97.8°F | Ht 67.0 in | Wt 281.2 lb

## 2021-09-26 DIAGNOSIS — R030 Elevated blood-pressure reading, without diagnosis of hypertension: Secondary | ICD-10-CM

## 2021-09-26 DIAGNOSIS — E1165 Type 2 diabetes mellitus with hyperglycemia: Secondary | ICD-10-CM

## 2021-09-26 DIAGNOSIS — R0683 Snoring: Secondary | ICD-10-CM | POA: Diagnosis not present

## 2021-09-26 LAB — BASIC METABOLIC PANEL
BUN: 20 mg/dL (ref 6–23)
CO2: 25 mEq/L (ref 19–32)
Calcium: 9.4 mg/dL (ref 8.4–10.5)
Chloride: 104 mEq/L (ref 96–112)
Creatinine, Ser: 1.06 mg/dL (ref 0.40–1.50)
GFR: 88.3 mL/min (ref >60.00)
Glucose, Bld: 116 mg/dL — ABNORMAL HIGH (ref 70–99)
Potassium: 4.2 mEq/L (ref 3.5–5.1)
Sodium: 137 mEq/L (ref 135–145)

## 2021-09-26 LAB — HEMOGLOBIN A1C: Hgb A1c MFr Bld: 6.1 % (ref 4.6–6.5)

## 2021-09-26 NOTE — Progress Notes (Signed)
? ?Established Patient Office Visit ? ?Subjective:  ?Patient ID: Austin Mcmahon, male    DOB: 1981/01/25  Age: 41 y.o. MRN: [id number] ? ?CC:  ?Chief Complaint  ?Patient presents with  ? Follow-up  ?  3 month follow up, patient fasting for labs.   ? ? ?HPI ?Austin Mcmahon presents for follow-up of diabetes and snoring.  Consultation for sleep study is soon.  He was able to go for diabetic teaching and is made some changes.  Quit smoking a year ago on his birthday.  No recent alcohol.  He does not use illicit drugs.  Blood pressure was normal at last visit.  Having no issues with the metformin. ? ?Past Medical History:  ?Diagnosis Date  ? Mass of subcutaneous tissue of back   ? OSA (obstructive sleep apnea)   ? ? ?Past Surgical History:  ?Procedure Laterality Date  ? GANGLION CYST EXCISION    ? from wrist  ? ? ?Family History  ?Problem Relation Age of Onset  ? Hypertension Other   ? Diabetes Other   ? Cancer Other   ? ? ?Social History  ? ?Socioeconomic History  ? Marital status: Married  ?  Spouse name: Not on file  ? Number of children: Not on file  ? Years of education: Not on file  ? Highest education level: Not on file  ?Occupational History  ? Not on file  ?Tobacco Use  ? Smoking status: Former  ?  Packs/day: 0.50  ?  Types: Cigarettes  ? Smokeless tobacco: Never  ?Vaping Use  ? Vaping Use: Never used  ?Substance and Sexual Activity  ? Alcohol use: Yes  ?  Comment: social "very little"  ? Drug use: No  ? Sexual activity: Not on file  ?Other Topics Concern  ? Not on file  ?Social History Narrative  ? Lives at home with spouse and 3 children  ? Right handed  ? Caffeine: none   ? ?Social Determinants of Health  ? ?Financial Resource Strain: Not on file  ?Food Insecurity: Not on file  ?Transportation Needs: Not on file  ?Physical Activity: Not on file  ?Stress: Not on file  ?Social Connections: Not on file  ?Intimate Partner Violence: Not on file  ? ? ?Outpatient Medications Prior to Visit  ?Medication Sig  Dispense Refill  ? acetaminophen (TYLENOL) 500 MG tablet Take 1,000 mg by mouth every 6 (six) hours as needed for mild pain.    ? metFORMIN (GLUCOPHAGE XR) 500 MG 24 hr tablet Take 1 tablet (500 mg total) by mouth daily with breakfast. 180 tablet 1  ? lidocaine (LIDODERM) 5 % Place 1 patch onto the skin daily. Remove & Discard patch within 12 hours or as directed by MD (Patient not taking: Reported on 09/14/2021) 30 patch 0  ? ?No facility-administered medications prior to visit.  ? ? ?No Known Allergies ? ?ROS ?Review of Systems  ?Constitutional: Negative.   ?HENT: Negative.    ?Eyes:  Negative for photophobia and visual disturbance.  ?Respiratory: Negative.    ?Cardiovascular: Negative.   ?Gastrointestinal: Negative.   ?Genitourinary: Negative.   ?Neurological:  Negative for speech difficulty, weakness and headaches.  ? ?  ?Objective:  ?  ?Physical Exam ?Vitals and nursing note reviewed.  ?Constitutional:   ?   Appearance: Normal appearance. He is obese.  ?HENT:  ?   Head: Normocephalic and atraumatic.  ?   Right Ear: Tympanic membrane, ear canal and external ear normal.  ?  Left Ear: Tympanic membrane, ear canal and external ear normal.  ?   Mouth/Throat:  ?   Mouth: Mucous membranes are moist.  ?   Pharynx: Oropharynx is clear. No oropharyngeal exudate or posterior oropharyngeal erythema.  ?Eyes:  ?   General: No scleral icterus.    ?   Right eye: No discharge.     ?   Left eye: No discharge.  ?   Extraocular Movements: Extraocular movements intact.  ?   Conjunctiva/sclera: Conjunctivae normal.  ?Cardiovascular:  ?   Rate and Rhythm: Normal rate and regular rhythm.  ?Pulmonary:  ?   Effort: Pulmonary effort is normal.  ?   Breath sounds: Normal breath sounds.  ?Musculoskeletal:  ?   Cervical back: No rigidity or tenderness.  ?Lymphadenopathy:  ?   Cervical: No cervical adenopathy.  ?Skin: ?   General: Skin is warm and dry.  ?Neurological:  ?   Mental Status: He is alert and oriented to person, place, and time.   ?Psychiatric:     ?   Mood and Affect: Mood normal.     ?   Behavior: Behavior normal.  ? ?Diabetic Foot Exam - Simple   ?Simple Foot Form ?Diabetic Foot exam was performed with the following findings: Yes 09/26/2021 10:34 AM  ?Visual Inspection ?See comments: Yes ?Sensation Testing ?Intact to touch and monofilament testing bilaterally: Yes ?Pulse Check ?Posterior Tibialis and Dorsalis pulse intact bilaterally: Yes ?Comments ?Feet are pes planus. ?  ?  ?BP (!) 160/90 (BP Location: Left Arm, Patient Position: Sitting, Cuff Size: Large)   Pulse 81   Temp 97.8 ?F (36.6 ?C) (Temporal)   Ht '5\' 7"'$  (1.702 m)   Wt 281 lb 3.2 oz (127.6 kg)   SpO2 96%   BMI 44.04 kg/m?  ?Wt Readings from Last 3 Encounters:  ?09/26/21 281 lb 3.2 oz (127.6 kg)  ?09/14/21 281 lb (127.5 kg)  ?06/23/21 283 lb (128.4 kg)  ? ?.fib ? ?Health Maintenance Due  ?Topic Date Due  ? HIV Screening  Never done  ? Hepatitis C Screening  Never done  ? ? ?There are no preventive care reminders to display for this patient. ? ?Lab Results  ?Component Value Date  ? TSH 1.51 06/17/2019  ? ?Lab Results  ?Component Value Date  ? WBC 4.5 06/23/2021  ? HGB 15.9 06/23/2021  ? HCT 46.1 06/23/2021  ? MCV 87.9 06/23/2021  ? PLT 291.0 06/23/2021  ? ?Lab Results  ?Component Value Date  ? NA 137 06/23/2021  ? K 3.8 06/23/2021  ? CO2 26 06/23/2021  ? GLUCOSE 109 (H) 06/23/2021  ? BUN 16 06/23/2021  ? CREATININE 1.02 06/23/2021  ? BILITOT 0.5 06/23/2021  ? ALKPHOS 42 06/23/2021  ? AST 24 06/23/2021  ? ALT 33 06/23/2021  ? PROT 7.6 06/23/2021  ? ALBUMIN 4.3 06/23/2021  ? CALCIUM 9.5 06/23/2021  ? ANIONGAP 13 05/07/2021  ? GFR 92.64 06/23/2021  ? ?Lab Results  ?Component Value Date  ? CHOL 191 06/23/2021  ? ?Lab Results  ?Component Value Date  ? HDL 29.50 (L) 06/23/2021  ? ?Lab Results  ?Component Value Date  ? LDLCALC 141 (H) 06/23/2021  ? ?Lab Results  ?Component Value Date  ? TRIG 103.0 06/23/2021  ? ?Lab Results  ?Component Value Date  ? CHOLHDL 6 06/23/2021  ? ?Lab  Results  ?Component Value Date  ? HGBA1C 10.8 (H) 05/07/2021  ? ? ?  ?Assessment & Plan:  ? ?Problem List Items Addressed This Visit   ? ?  ?  Endocrine  ? Type 2 diabetes mellitus with hyperglycemia, without long-term current use of insulin (HCC) - Primary  ? Relevant Orders  ? Basic metabolic panel  ? Hemoglobin A1c  ?  ? Other  ? Snores  ? Elevated blood pressure reading  ? Relevant Orders  ? Basic metabolic panel  ? ? ?No orders of the defined types were placed in this encounter. ? ? ?Follow-up: Return in about 3 months (around 12/27/2021).  ? ?Information was given on preventing hypertension.  Encouraged exercise and further weight loss.  Briefly discussed using insulin or may try a GLP-1 agonist.  Will obtain BP cuff and periodically check and record blood pressures.  Information was given on Ozempic. ?Libby Maw, MD ?

## 2021-09-28 ENCOUNTER — Ambulatory Visit: Payer: 59 | Admitting: Family Medicine

## 2021-10-05 ENCOUNTER — Ambulatory Visit (INDEPENDENT_AMBULATORY_CARE_PROVIDER_SITE_OTHER): Payer: 59 | Admitting: Neurology

## 2021-10-05 DIAGNOSIS — R0683 Snoring: Secondary | ICD-10-CM

## 2021-10-05 DIAGNOSIS — G4733 Obstructive sleep apnea (adult) (pediatric): Secondary | ICD-10-CM | POA: Diagnosis not present

## 2021-10-05 DIAGNOSIS — G478 Other sleep disorders: Secondary | ICD-10-CM

## 2021-10-05 DIAGNOSIS — R0681 Apnea, not elsewhere classified: Secondary | ICD-10-CM

## 2021-10-05 DIAGNOSIS — G4719 Other hypersomnia: Secondary | ICD-10-CM

## 2021-10-05 DIAGNOSIS — R030 Elevated blood-pressure reading, without diagnosis of hypertension: Secondary | ICD-10-CM

## 2021-10-10 NOTE — Progress Notes (Signed)
See procedure note.

## 2021-10-11 ENCOUNTER — Encounter: Payer: Self-pay | Admitting: *Deleted

## 2021-10-11 ENCOUNTER — Telehealth: Payer: Self-pay | Admitting: *Deleted

## 2021-10-11 NOTE — Telephone Encounter (Signed)
I called pt. I advised pt that Dr. Rexene Alberts reviewed their sleep study results and found that pt had OSA severe range.. Dr. Rexene Alberts recommends that pt start Autopap. I reviewed PAP compliance expectations with the pt. Pt is agreeable to starting an auto-PAP. I advised pt that an order will be sent to a DME, [facility], and they will call the pt within about one week after they file with the pt's insurance. [facility] will show the pt how to use the machine, fit for masks, and troubleshoot the auto-PAP if needed. A follow up appt was made for insurance purposes with Dr. Rexene Alberts on 12-26-2021 at [facility]. Pt verbalized understanding to arrive 15 minutes early and bring their auto-PAP. A letter with all of this information in it will be mailed to the pt as a reminder. I verified with the pt that the address we have on file is correct. Pt verbalized understanding of results. Pt had no questions at this time but was encouraged to call back if questions arise. I have sent the order to [facility] and have received confirmation that they have received the order.  ?

## 2021-10-11 NOTE — Procedures (Signed)
? ?  [facility] ? ?HOME SLEEP TEST (Watch PAT) REPORT ? ?STUDY DATE: 10/06/2021 ? ?DOB: 05-05-1981 ? ?MRN: [id number] ? ?ORDERING CLINICIAN: Star Age, MD, PhD ?  ?REFERRING CLINICIAN: Libby Maw, MD  ? ?CLINICAL INFORMATION/HISTORY: 41 year old right-handed gentleman with an underlying history of recent diagnosis of diabetes, elevated blood pressure values, and severe obesity with a BMI of over 40, who reports snoring and excessive daytime somnolence, as well as witnessed apneas per wife's report.   ? ?Epworth sleepiness score: 15/24. ? ?BMI: 44.3 kg/m? ? ?FINDINGS:  ? ?Sleep Summary:  ? ?Total Recording Time (hours, min): 7 hours, 48 minutes ? ?Total Sleep Time (hours, min):  7 hours, 9 minutes  ? ?Percent REM (%):    21%  ? ?Respiratory Indices:  ? ?Calculated pAHI (per hour):  49.3/hour        ? ?REM pAHI:    71.4/hour      ? ?NREM pAHI: 44.2/hour ? ?Oxygen Saturation Statistics:  ?  ?Oxygen Saturation (%) Mean: 94%  ? ?Minimum oxygen saturation (%):                 77%  ? ?O2 Saturation Range (%): 77-100%   ? ?O2 Saturation (minutes) <=88%: 6.8 min ? ?Pulse Rate Statistics:  ? ?Pulse Mean (bpm):    74/min   ? ?Pulse Range (53-115/min)  ? ?IMPRESSION: OSA (obstructive sleep apnea), severe ? ?RECOMMENDATION:  ?This home sleep test demonstrates severe obstructive sleep apnea with a total AHI of 49.3/hour and O2 nadir of 77%.  Moderate to loud snoring was detected fairly consistently throughout the night.  Treatment with positive airway pressure is highly recommended. This will require - ideally - a full night CPAP titration study for proper treatment settings, O2 monitoring and mask fitting. For now, the patient will be advised to proceed with an autoPAP titration/trial at home.  A laboratory attended titration study can be considered in the future for optimization of his treatment and better tolerance of therapy.  Alternative treatment options are limited secondary to the severity  of the patient's sleep disordered breathing.  Concomitant weight loss is highly recommended.  Please note, that untreated obstructive sleep apnea may carry additional perioperative morbidity. Patients with significant obstructive sleep apnea should receive perioperative PAP therapy and the surgeons and particularly the anesthesiologist should be informed of the diagnosis and the severity of the sleep disordered breathing. ?The patient should be cautioned not to drive, work at heights, or operate dangerous or heavy equipment when tired or sleepy. Review and reiteration of good sleep hygiene measures should be pursued with any patient. ?Other causes of the patient's symptoms, including circadian rhythm disturbances, an underlying mood disorder, medication effect and/or an underlying medical problem cannot be ruled out based on this test. Clinical correlation is recommended. The patient and his referring provider will be notified of the test results. The patient will be seen in follow up in sleep clinic at [facility]. ? ?I certify that I have reviewed the raw data recording prior to the issuance of this report in accordance with the standards of the American Academy of Sleep Medicine (AASM). ? ?INTERPRETING PHYSICIAN:  ? ?Star Age, MD, PhD  ?Board Certified in Neurology and Sleep Medicine ? ?[facility] ?[address], [address] ?[place], [place] [address] ?([phone] ? ? ? ? ? ? ? ? ? ? ? ? ? ? ? ? ?

## 2021-10-11 NOTE — Addendum Note (Signed)
Addended by: Star Age on: 10/11/2021 12:13 PM ? ? Modules accepted: Orders ? ?

## 2021-10-11 NOTE — Telephone Encounter (Signed)
-----   Message from Star Age, MD sent at 10/11/2021 12:13 PM EDT ----- ?Patient referred by Dr. Ethelene Hal, seen by me on 09/14/2021, patient had a HST on 10/06/2021.   ? ?Please call and notify the patient that the recent home sleep test showed obstructive sleep apnea in the severe range. I recommend treatment for this in the form of autoPAP, which means, that we don't have to bring him in for a sleep study with CPAP, but will let him start using a so called autoPAP machine at home, through a DME company (of his choice, or as per insurance requirement). The DME representative will fit the patient with a mask of choice, educate him on how to use the machine, how to put the mask on, etc. I have placed an order in the chart. Please send the order to a local DME, talk to patient, send report to referring MD. Please also reinforce the need for compliance with treatment. We will need a FU in sleep clinic for 10 weeks post-PAP set up, please arrange that with me or one of our NPs. Thanks,  ? ?Star Age, MD, PhD ?[facility]) ? ? ? ? ?

## 2021-12-22 ENCOUNTER — Telehealth: Payer: Self-pay | Admitting: *Deleted

## 2021-12-22 NOTE — Telephone Encounter (Signed)
I called pt.  He was just set up 12-01-2021, will need more time on machine.  Made f/u appt 02-06-2022 at 0800 with Amy Lomax NP. Pt understood.

## 2021-12-26 ENCOUNTER — Ambulatory Visit: Payer: 59 | Admitting: Neurology

## 2021-12-27 ENCOUNTER — Ambulatory Visit: Payer: 59 | Admitting: Family Medicine

## 2021-12-27 ENCOUNTER — Telehealth: Payer: Self-pay | Admitting: Family Medicine

## 2021-12-27 NOTE — Telephone Encounter (Signed)
6.20.23 no show letter sent

## 2022-01-04 ENCOUNTER — Ambulatory Visit (INDEPENDENT_AMBULATORY_CARE_PROVIDER_SITE_OTHER): Payer: 59 | Admitting: Family Medicine

## 2022-01-04 ENCOUNTER — Encounter: Payer: Self-pay | Admitting: Family Medicine

## 2022-01-04 VITALS — BP 140/82 | HR 76 | Temp 98.0°F | Ht 67.0 in | Wt 289.8 lb

## 2022-01-04 DIAGNOSIS — Z6841 Body Mass Index (BMI) 40.0 and over, adult: Secondary | ICD-10-CM

## 2022-01-04 DIAGNOSIS — E7800 Pure hypercholesterolemia, unspecified: Secondary | ICD-10-CM

## 2022-01-04 DIAGNOSIS — R0683 Snoring: Secondary | ICD-10-CM | POA: Diagnosis not present

## 2022-01-04 DIAGNOSIS — E1165 Type 2 diabetes mellitus with hyperglycemia: Secondary | ICD-10-CM

## 2022-01-04 DIAGNOSIS — I10 Essential (primary) hypertension: Secondary | ICD-10-CM | POA: Diagnosis not present

## 2022-01-04 LAB — BASIC METABOLIC PANEL
BUN: 22 mg/dL (ref 6–23)
CO2: 26 mEq/L (ref 19–32)
Calcium: 9.5 mg/dL (ref 8.4–10.5)
Chloride: 103 mEq/L (ref 96–112)
Creatinine, Ser: 1 mg/dL (ref 0.40–1.50)
GFR: 94.51 mL/min (ref >60.00)
Glucose, Bld: 132 mg/dL — ABNORMAL HIGH (ref 70–99)
Potassium: 4 mEq/L (ref 3.5–5.1)
Sodium: 136 mEq/L (ref 135–145)

## 2022-01-04 LAB — LIPID PANEL
Cholesterol: 198 mg/dL (ref 0–200)
HDL: 28.5 mg/dL — ABNORMAL LOW (ref >39.00)
LDL Cholesterol: 142 mg/dL — ABNORMAL HIGH (ref 0–99)
NonHDL: 169.52
Total CHOL/HDL Ratio: 7
Triglycerides: 139 mg/dL (ref 0.0–149.0)
VLDL: 27.8 mg/dL (ref 0.0–40.0)

## 2022-01-04 LAB — HEMOGLOBIN A1C: Hgb A1c MFr Bld: 6.7 % — ABNORMAL HIGH (ref 4.6–6.5)

## 2022-01-04 MED ORDER — METFORMIN HCL ER 500 MG PO TB24: 500.0000 mg | ORAL_TABLET | Freq: Every day | ORAL | 1 refills | Status: DC

## 2022-01-04 MED ORDER — OLMESARTAN MEDOXOMIL 20 MG PO TABS: 20.0000 mg | ORAL_TABLET | Freq: Every day | ORAL | 1 refills | Status: DC

## 2022-01-04 NOTE — Progress Notes (Signed)
Established Patient Office Visit  Subjective   Patient ID: Austin Mcmahon, male    DOB: February 22, 1981  Age: 41 y.o. MRN: [id number]  Chief Complaint  Patient presents with   Follow-up    3 month follow up, no concerns. Patient fasting     HPI follow-up of type 2 diabetes, elevated blood pressure elevated LDL cholesterol.  Doing well with metformin.  Fortunately is having no GI side effects.  Has lowered the fat and cholesterol in his diet.      Review of Systems  Constitutional: Negative.   HENT: Negative.    Eyes:  Negative for blurred vision, discharge and redness.  Respiratory: Negative.    Cardiovascular: Negative.   Gastrointestinal:  Negative for abdominal pain.  Genitourinary: Negative.   Musculoskeletal: Negative.  Negative for myalgias.  Skin:  Negative for rash.  Neurological:  Negative for tingling, loss of consciousness and weakness.  Endo/Heme/Allergies:  Negative for polydipsia.      Objective:     BP 140/82 (BP Location: Right Arm, Patient Position: Sitting, Cuff Size: Large)   Pulse 76   Temp 98 F (36.7 C) (Temporal)   Ht '5\' 7"'$  (1.702 m)   Wt 289 lb 12.8 oz (131.5 kg)   SpO2 97%   BMI 45.39 kg/m  BP Readings from Last 3 Encounters:  01/04/22 140/82  09/26/21 (!) 160/90  09/14/21 (!) 139/95   Wt Readings from Last 3 Encounters:  01/04/22 289 lb 12.8 oz (131.5 kg)  09/26/21 281 lb 3.2 oz (127.6 kg)  09/14/21 281 lb (127.5 kg)      Physical Exam Constitutional:      General: He is not in acute distress.    Appearance: Normal appearance. He is not ill-appearing, toxic-appearing or diaphoretic.  HENT:     Head: Normocephalic and atraumatic.     Right Ear: External ear normal.     Left Ear: External ear normal.  Eyes:     General: No scleral icterus.       Right eye: No discharge.        Left eye: No discharge.     Extraocular Movements: Extraocular movements intact.     Conjunctiva/sclera: Conjunctivae normal.  Cardiovascular:      Rate and Rhythm: Normal rate and regular rhythm.  Pulmonary:     Effort: Pulmonary effort is normal. No respiratory distress.     Breath sounds: Normal breath sounds.  Musculoskeletal:     Cervical back: No rigidity or tenderness.  Skin:    General: Skin is warm and dry.  Neurological:     Mental Status: He is alert and oriented to person, place, and time.  Psychiatric:        Mood and Affect: Mood normal.        Behavior: Behavior normal.      Results for orders placed or performed in visit on 96/29/52  Basic metabolic panel  Result Value Ref Range   Sodium 136 135 - 145 mEq/L   Potassium 4.0 3.5 - 5.1 mEq/L   Chloride 103 96 - 112 mEq/L   CO2 26 19 - 32 mEq/L   Glucose, Bld 132 (H) 70 - 99 mg/dL   BUN 22 6 - 23 mg/dL   Creatinine, Ser 1.00 0.40 - 1.50 mg/dL   GFR 94.51 >60.00 mL/min   Calcium 9.5 8.4 - 10.5 mg/dL  Hemoglobin A1c  Result Value Ref Range   Hgb A1c MFr Bld 6.7 (H) 4.6 - 6.5 %  Lipid  panel  Result Value Ref Range   Cholesterol 198 0 - 200 mg/dL   Triglycerides 139.0 0.0 - 149.0 mg/dL   HDL 28.50 (L) >39.00 mg/dL   VLDL 27.8 0.0 - 40.0 mg/dL   LDL Cholesterol 142 (H) 0 - 99 mg/dL   Total CHOL/HDL Ratio 7    NonHDL 169.52       The ASCVD Risk score (Arnett DK, et al., 2019) failed to calculate for the following reasons:   The 2019 ASCVD risk score is only valid for ages 22 to 74    Assessment & Plan:   Problem List Items Addressed This Visit       Cardiovascular and Mediastinum   Essential hypertension - Primary   Relevant Medications   olmesartan (BENICAR) 20 MG tablet   atorvastatin (LIPITOR) 20 MG tablet   Other Relevant Orders   Basic metabolic panel (Completed)     Endocrine   Type 2 diabetes mellitus with hyperglycemia, without long-term current use of insulin (HCC)   Relevant Medications   metFORMIN (GLUCOPHAGE XR) 500 MG 24 hr tablet   olmesartan (BENICAR) 20 MG tablet   atorvastatin (LIPITOR) 20 MG tablet   Other Relevant  Orders   Basic metabolic panel (Completed)   Hemoglobin A1c (Completed)     Other   Class 3 severe obesity due to excess calories with body mass index (BMI) of 45.0 to 49.9 in adult [facility])   Relevant Medications   metFORMIN (GLUCOPHAGE XR) 500 MG 24 hr tablet   Snores   Elevated LDL cholesterol level   Relevant Medications   atorvastatin (LIPITOR) 20 MG tablet   Other Relevant Orders   Lipid panel (Completed)    Return in about 3 months (around 04/06/2022).  Advised exercise, even if it is just walking.  That would be great.  Try to lose weight.  Will start olmesartan blood pressure and renal protection.  That he may need another medicine.  Continue metformin.  Briefly discussed GLP-1 agonist.  Discussed the possible need for a cholesterol medicine with his elevated risk of vascular disease.  Information was given on the Mediterranean diet to lower his fat and cholesterol.  Libby Maw, MD

## 2022-01-05 MED ORDER — ATORVASTATIN CALCIUM 20 MG PO TABS: 20.0000 mg | ORAL_TABLET | Freq: Every day | ORAL | 3 refills | Status: DC

## 2022-01-05 NOTE — Addendum Note (Signed)
Addended by: Jon Billings on: 01/05/2022 07:57 AM   Modules accepted: Orders

## 2022-02-02 NOTE — Progress Notes (Signed)
PATIENT: Austin Mcmahon DOB: 1981-01-10  REASON FOR VISIT: follow up HISTORY FROM: patient  Chief Complaint  Patient presents with   Follow-up    Pt alone, rm 2 here for initial cpap visit. he states that sometimes wakes up with dry eyes. Overall stable with the machine. DME [facility].      HISTORY OF PRESENT ILLNESS:  02/06/22 ALL:  Austin Mcmahon is a 41 y.o. male here today for follow up for OSA on CPAP.  He was seen in consult with Dr Rexene Alberts for snoring, daytime sleepiness and witnessed apneic events. ST 10/05/2021 showed severe OSA with total AHI 49.3/hr and O2 nadir of 77%. AutoPAP ordered. He is adjusting to therapy. He does feel much more alert and refreshed with using therapy. He reports headaches have improved significantly. He does have dry eyes at times. He has some air blowing from the vent. No significant leak in mask.     HISTORY: (copied from Dr Guadelupe Sabin previous note)  Dear Dr. Ethelene Hal,    I saw your patient, Austin Mcmahon, upon your kind request in my sleep clinic today for initial consultation of his sleep disorder, in particular, concern for underlying obstructive sleep apnea.  The patient is unaccompanied today.  As you know, Austin Mcmahon is a 41 year old right-handed gentleman with an underlying history of recent diagnosis of diabetes, elevated blood pressure values, and severe obesity with a BMI of over 40, who reports snoring and excessive daytime somnolence, as well as witnessed apneas per wife's report.  Looking back, he admits that symptoms have been ongoing for 5 to 7 years now.  He has woken up with a sense of gasping for air.  He is now working on weight loss, since his diabetes diagnosis he has been exercising on a regular basis and has eliminated practically all caffeine.  He used to drink quite a bit of soda and energy drinks before. I reviewed your office notes from 05/20/2021 as well as 06/23/2021.  His Epworth sleepiness score is 15 out  of 24, fatigue severity score is 31 out of 63.  He lives with his family including wife and 3 children, ages 29, 7 and 13.  He works as a Scientist, clinical (histocompatibility and immunogenetics) and his work schedule is variable.  He is in bed typically around 2 AM and they do have a TV on in the bedroom, sometimes it stays on all night and sometimes it turns off on a timer, his rise time is around 7 AM for work.  He quit smoking in July 2022.  He has no obvious family history of sleep apnea.  He drinks alcohol rarely.  They have 1 dog in the household.    REVIEW OF SYSTEMS: Out of a complete 14 system review of symptoms, the patient complains only of the following symptoms, dry eye, and all other reviewed systems are negative.  ESS: 5/24, previously 15/24  ALLERGIES: No Known Allergies  HOME MEDICATIONS: Outpatient Medications Prior to Visit  Medication Sig Dispense Refill   atorvastatin (LIPITOR) 20 MG tablet Take 1 tablet (20 mg total) by mouth daily. 90 tablet 3   metFORMIN (GLUCOPHAGE XR) 500 MG 24 hr tablet Take 1 tablet (500 mg total) by mouth daily with breakfast. 90 tablet 1   olmesartan (BENICAR) 20 MG tablet Take 1 tablet (20 mg total) by mouth daily. 90 tablet 1   No facility-administered medications prior to visit.    PAST MEDICAL HISTORY: Past Medical History:  Diagnosis Date  Mass of subcutaneous tissue of back    OSA (obstructive sleep apnea)     PAST SURGICAL HISTORY: Past Surgical History:  Procedure Laterality Date   GANGLION CYST EXCISION     from wrist    FAMILY HISTORY: Family History  Problem Relation Age of Onset   Hypertension Other    Diabetes Other    Cancer Other     SOCIAL HISTORY: Social History   Socioeconomic History   Marital status: Married    Spouse name: Not on file   Number of children: Not on file   Years of education: Not on file   Highest education level: Not on file  Occupational History   Not on file  Tobacco Use   Smoking status: Former    Packs/day: 0.50     Types: Cigarettes   Smokeless tobacco: Never  Vaping Use   Vaping Use: Never used  Substance and Sexual Activity   Alcohol use: Yes    Comment: social "very little"   Drug use: No   Sexual activity: Not on file  Other Topics Concern   Not on file  Social History Narrative   Lives at home with spouse and 3 children   Right handed   Caffeine: none    Social Determinants of Health   Financial Resource Strain: Not on file  Food Insecurity: Not on file  Transportation Needs: Not on file  Physical Activity: Not on file  Stress: Not on file  Social Connections: Not on file  Intimate Partner Violence: Not on file     PHYSICAL EXAM  Vitals:   02/06/22 0747  BP: (!) 151/105  Pulse: 78  Weight: 292 lb (132.5 kg)  Height: '5\' 7"'$  (1.702 m)   Body mass index is 45.73 kg/m.  Generalized: Well developed, in no acute distress  Cardiology: normal rate and rhythm, no murmur noted Respiratory: clear to auscultation bilaterally  Neurological examination  Mentation: Alert oriented to time, place, history taking. Follows all commands speech and language fluent Cranial nerve II-XII: Pupils were equal round reactive to light. Extraocular movements were full, visual field were full  Motor: The motor testing reveals 5 over 5 strength of all 4 extremities. Good symmetric motor tone is noted throughout.  Gait and station: Gait is normal.    DIAGNOSTIC DATA (LABS, IMAGING, TESTING) - I reviewed patient records, labs, notes, testing and imaging myself where available.      No data to display           Lab Results  Component Value Date   WBC 4.5 06/23/2021   HGB 15.9 06/23/2021   HCT 46.1 06/23/2021   MCV 87.9 06/23/2021   PLT 291.0 06/23/2021      Component Value Date/Time   NA 136 01/04/2022 1113   K 4.0 01/04/2022 1113   CL 103 01/04/2022 1113   CO2 26 01/04/2022 1113   GLUCOSE 132 (H) 01/04/2022 1113   BUN 22 01/04/2022 1113   CREATININE 1.00 01/04/2022 1113    CALCIUM 9.5 01/04/2022 1113   PROT 7.6 06/23/2021 1409   ALBUMIN 4.3 06/23/2021 1409   AST 24 06/23/2021 1409   ALT 33 06/23/2021 1409   ALKPHOS 42 06/23/2021 1409   BILITOT 0.5 06/23/2021 1409   GFRNONAA >60 05/07/2021 1319   GFRAA >90 08/14/2013 0530   Lab Results  Component Value Date   CHOL 198 01/04/2022   HDL 28.50 (L) 01/04/2022   LDLCALC 142 (H) 01/04/2022   LDLDIRECT 160.0 06/23/2021  TRIG 139.0 01/04/2022   CHOLHDL 7 01/04/2022   Lab Results  Component Value Date   HGBA1C 6.7 (H) 01/04/2022   No results found for: "VITAMINB12" Lab Results  Component Value Date   TSH 1.51 06/17/2019     ASSESSMENT AND PLAN 41 y.o. year old male  has a past medical history of Mass of subcutaneous tissue of back and OSA (obstructive sleep apnea). here with     ICD-10-CM   1. OSA on CPAP  G47.33    Z99.89        Austin Mcmahon is doing well on CPAP therapy. He reports significant improvement in headaches and daytime energy levels. He is waking feeling more refreshed. Compliance report reveals acceptable daily and sub optimal four hour usage. AHI now 5.9/hr. He was encouraged to continue using CPAP nightly and for greater than 4 hours each night. I have advised that he continue to monitor dry eye. Could consider sleep mask versus new headgear if needed. He will reach out to [facility] as needed. Risks of untreated sleep apnea review and education materials provided. He was encouraged to keep a close eye on his BP. He has not taken BP meds, today. Healthy lifestyle habits encouraged. He will follow up in 6 months, sooner if needed. He verbalizes understanding and agreement with this plan.    No orders of the defined types were placed in this encounter.    No orders of the defined types were placed in this encounter.     Debbora Presto, FNP-C 02/06/2022, 8:06 AM [facility] [address] [place], [place] [address] [phone]

## 2022-02-02 NOTE — Patient Instructions (Addendum)
Please continue using your CPAP regularly. While your insurance requires that you use CPAP at least 4 hours each night on 70% of the nights, I recommend, that you not skip any nights and use it throughout the night if you can. Getting used to CPAP and staying with the treatment long term does take time and patience and discipline. Untreated obstructive sleep apnea when it is moderate to severe can have an adverse impact on cardiovascular health and raise her risk for heart disease, arrhythmias, hypertension, congestive heart failure, stroke and diabetes. Untreated obstructive sleep apnea causes sleep disruption, nonrestorative sleep, and sleep deprivation. This can have an impact on your day to day functioning and cause daytime sleepiness and impairment of cognitive function, memory loss, mood disturbance, and problems focussing. Using CPAP regularly can improve these symptoms.  Continue working on compliance. Monitor BP at home.   Follow up in 6 months

## 2022-02-06 ENCOUNTER — Ambulatory Visit (INDEPENDENT_AMBULATORY_CARE_PROVIDER_SITE_OTHER): Payer: 59 | Admitting: Family Medicine

## 2022-02-06 ENCOUNTER — Encounter: Payer: Self-pay | Admitting: Family Medicine

## 2022-02-06 VITALS — BP 151/105 | HR 78 | Ht 67.0 in | Wt 292.0 lb

## 2022-02-06 DIAGNOSIS — Z9989 Dependence on other enabling machines and devices: Secondary | ICD-10-CM | POA: Diagnosis not present

## 2022-02-06 DIAGNOSIS — G4733 Obstructive sleep apnea (adult) (pediatric): Secondary | ICD-10-CM

## 2022-02-09 NOTE — Telephone Encounter (Signed)
Fee waived, cancelled appt due to family emergency

## 2022-04-06 ENCOUNTER — Encounter: Payer: Self-pay | Admitting: Family Medicine

## 2022-04-06 ENCOUNTER — Ambulatory Visit (INDEPENDENT_AMBULATORY_CARE_PROVIDER_SITE_OTHER): Payer: 59 | Admitting: Family Medicine

## 2022-04-06 VITALS — BP 134/78 | HR 83 | Temp 97.7°F | Ht 67.0 in | Wt 291.0 lb

## 2022-04-06 DIAGNOSIS — E7800 Pure hypercholesterolemia, unspecified: Secondary | ICD-10-CM

## 2022-04-06 DIAGNOSIS — E1165 Type 2 diabetes mellitus with hyperglycemia: Secondary | ICD-10-CM

## 2022-04-06 DIAGNOSIS — I10 Essential (primary) hypertension: Secondary | ICD-10-CM

## 2022-04-06 LAB — BASIC METABOLIC PANEL
BUN: 20 mg/dL (ref 6–23)
CO2: 25 mEq/L (ref 19–32)
Calcium: 9.5 mg/dL (ref 8.4–10.5)
Chloride: 105 mEq/L (ref 96–112)
Creatinine, Ser: 1.05 mg/dL (ref 0.40–1.50)
GFR: 88.98 mL/min (ref >60.00)
Glucose, Bld: 148 mg/dL — ABNORMAL HIGH (ref 70–99)
Potassium: 4.3 mEq/L (ref 3.5–5.1)
Sodium: 138 mEq/L (ref 135–145)

## 2022-04-06 LAB — LIPID PANEL
Cholesterol: 134 mg/dL (ref 0–200)
HDL: 25.6 mg/dL — ABNORMAL LOW (ref >39.00)
LDL Cholesterol: 81 mg/dL (ref 0–99)
NonHDL: 108.53
Total CHOL/HDL Ratio: 5
Triglycerides: 139 mg/dL (ref 0.0–149.0)
VLDL: 27.8 mg/dL (ref 0.0–40.0)

## 2022-04-06 LAB — HEMOGLOBIN A1C: Hgb A1c MFr Bld: 7.9 % — ABNORMAL HIGH (ref 4.6–6.5)

## 2022-04-06 NOTE — Progress Notes (Addendum)
Established Patient Office Visit  Subjective   Patient ID: Austin Mcmahon, male    DOB: 1981-05-17  Age: 41 y.o. MRN: [id number]  Chief Complaint  Patient presents with   Follow-up    3 month follow up, no concerns. Patient fasting.     HPI doing well with olmesartan.  Blood pressure control is improving.  Continues with metformin and atorvastatin.  He is taking atorvastatin daily.  Continues to find it challenging to lose weight.    Review of Systems  Constitutional: Negative.   HENT: Negative.    Eyes:  Negative for blurred vision, discharge and redness.  Respiratory: Negative.    Cardiovascular: Negative.   Gastrointestinal:  Negative for abdominal pain.  Genitourinary: Negative.   Musculoskeletal: Negative.  Negative for myalgias.  Skin:  Negative for rash.  Neurological:  Negative for tingling, loss of consciousness and weakness.  Endo/Heme/Allergies:  Negative for polydipsia.      Objective:     BP 134/78 (BP Location: Right Arm, Patient Position: Sitting, Cuff Size: Large)   Pulse 83   Temp 97.7 F (36.5 C) (Temporal)   Ht '5\' 7"'$  (1.702 m)   Wt 291 lb (132 kg)   SpO2 95%   BMI 45.58 kg/m  BP Readings from Last 3 Encounters:  04/06/22 134/78  02/06/22 (!) 151/105  01/04/22 140/82   Wt Readings from Last 3 Encounters:  04/06/22 291 lb (132 kg)  02/06/22 292 lb (132.5 kg)  01/04/22 289 lb 12.8 oz (131.5 kg)      Physical Exam Constitutional:      General: He is not in acute distress.    Appearance: Normal appearance. He is not ill-appearing, toxic-appearing or diaphoretic.  HENT:     Head: Normocephalic and atraumatic.     Right Ear: External ear normal.     Left Ear: External ear normal.     Mouth/Throat:     Mouth: Mucous membranes are moist.     Pharynx: Oropharynx is clear. No oropharyngeal exudate or posterior oropharyngeal erythema.  Eyes:     General: No scleral icterus.       Right eye: No discharge.        Left eye: No discharge.      Extraocular Movements: Extraocular movements intact.     Conjunctiva/sclera: Conjunctivae normal.     Pupils: Pupils are equal, round, and reactive to light.  Cardiovascular:     Rate and Rhythm: Normal rate and regular rhythm.  Pulmonary:     Effort: Pulmonary effort is normal. No respiratory distress.     Breath sounds: Normal breath sounds.  Abdominal:     General: Bowel sounds are normal.     Tenderness: There is no abdominal tenderness. There is no guarding.  Musculoskeletal:     Cervical back: No rigidity or tenderness.  Skin:    General: Skin is warm and dry.  Neurological:     Mental Status: He is alert and oriented to person, place, and time.  Psychiatric:        Mood and Affect: Mood normal.        Behavior: Behavior normal.      Results for orders placed or performed in visit on 17/61/60  Basic metabolic panel  Result Value Ref Range   Sodium 138 135 - 145 mEq/L   Potassium 4.3 3.5 - 5.1 mEq/L   Chloride 105 96 - 112 mEq/L   CO2 25 19 - 32 mEq/L   Glucose, Bld 148 (H)  70 - 99 mg/dL   BUN 20 6 - 23 mg/dL   Creatinine, Ser 1.05 0.40 - 1.50 mg/dL   GFR 88.98 >60.00 mL/min   Calcium 9.5 8.4 - 10.5 mg/dL  Hemoglobin A1c  Result Value Ref Range   Hgb A1c MFr Bld 7.9 (H) 4.6 - 6.5 %  Lipid panel  Result Value Ref Range   Cholesterol 134 0 - 200 mg/dL   Triglycerides 139.0 0.0 - 149.0 mg/dL   HDL 25.60 (L) >39.00 mg/dL   VLDL 27.8 0.0 - 40.0 mg/dL   LDL Cholesterol 81 0 - 99 mg/dL   Total CHOL/HDL Ratio 5    NonHDL 108.53       The 10-year ASCVD risk score (Arnett DK, et al., 2019) is: 11.3%    Assessment & Plan:   Problem List Items Addressed This Visit       Cardiovascular and Mediastinum   Essential hypertension   Relevant Medications   atorvastatin (LIPITOR) 40 MG tablet   Other Relevant Orders   Basic metabolic panel (Completed)     Endocrine   Type 2 diabetes mellitus with hyperglycemia, without long-term current use of insulin (HCC) -  Primary   Relevant Medications   atorvastatin (LIPITOR) 40 MG tablet   metFORMIN (GLUCOPHAGE-XR) 500 MG 24 hr tablet   Other Relevant Orders   Basic metabolic panel (Completed)   Hemoglobin A1c (Completed)     Other   Elevated LDL cholesterol level   Relevant Medications   atorvastatin (LIPITOR) 40 MG tablet   Other Relevant Orders   Lipid panel (Completed)    Return in about 6 months (around 10/05/2022).  Continue weight loss journey.  Discussed avoiding sweets he drinks.  Encouraged regular exercise by walking for 30 minutes 5 days weekly.  Given information on exercising to lose weight.  Continue all medications as above.  Discussed increasing atorvastatin pending LDL results.  Declines flu vaccine today.  Libby Maw, MD

## 2022-04-07 MED ORDER — ATORVASTATIN CALCIUM 40 MG PO TABS: 40.0000 mg | ORAL_TABLET | Freq: Every day | ORAL | 3 refills | Status: AC

## 2022-04-07 MED ORDER — METFORMIN HCL ER 500 MG PO TB24: 500.0000 mg | ORAL_TABLET | Freq: Every day | ORAL | 1 refills | Status: DC

## 2022-04-07 NOTE — Addendum Note (Signed)
Addended by: Jon Billings on: 04/07/2022 07:52 AM   Modules accepted: Orders

## 2022-07-05 ENCOUNTER — Other Ambulatory Visit: Payer: Self-pay

## 2022-07-05 ENCOUNTER — Telehealth: Payer: Self-pay | Admitting: Family Medicine

## 2022-07-05 DIAGNOSIS — E1165 Type 2 diabetes mellitus with hyperglycemia: Secondary | ICD-10-CM

## 2022-07-05 MED ORDER — METFORMIN HCL ER 500 MG PO TB24: 500.0000 mg | ORAL_TABLET | Freq: Two times a day (BID) | ORAL | 0 refills | Status: DC

## 2022-07-05 NOTE — Telephone Encounter (Signed)
Patient calling for refill and increase on Metformin. Last lab note on 04/06/22 states to increase Metformin to BID instead of daily. Per pharmacy is it okay to increase Metformin XR '500mg'$  or does Rx need to be change to with out XR? Please advise patient completely out of medication.

## 2022-07-05 NOTE — Telephone Encounter (Signed)
Caller Name: Austin Mcmahon Call back phone #: [phone]  Reason for Call: Pt states that Metformin had been adjusted for him to take twice a day instead of once. Pharmacy still has the old instructions on prescription. Can this please be resent? He is completely out [id number] [place] [address], [id number]: [phone] Fax: [phone]

## 2022-07-06 NOTE — Telephone Encounter (Signed)
Patient aware requested Rx sent in

## 2022-08-10 ENCOUNTER — Encounter: Payer: Self-pay | Admitting: *Deleted

## 2022-08-10 NOTE — Patient Instructions (Incomplete)

## 2022-08-10 NOTE — Progress Notes (Deleted)
PATIENT: Austin Mcmahon DOB: 02-15-1981  REASON FOR VISIT: follow up HISTORY FROM: patient  No chief complaint on file.    HISTORY OF PRESENT ILLNESS:  08/10/22 ALL:  Austin Mcmahon returns for follow up for OSA on CPAP.   02/06/2022 ALL: Austin Mcmahon is a 42 y.o. male here today for follow up for OSA on CPAP.  He was seen in consult with Dr Rexene Alberts for snoring, daytime sleepiness and witnessed apneic events. ST 10/05/2021 showed severe OSA with total AHI 49.3/hr and O2 nadir of 77%. AutoPAP ordered. He is adjusting to therapy. He does feel much more alert and refreshed with using therapy. He reports headaches have improved significantly. He does have dry eyes at times. He has some air blowing from the vent. No significant leak in mask.     HISTORY: (copied from Dr Guadelupe Sabin previous note)  Dear Dr. Ethelene Hal,    I saw your patient, Austin Mcmahon, upon your kind request in my sleep clinic today for initial consultation of his sleep disorder, in particular, concern for underlying obstructive sleep apnea.  The patient is unaccompanied today.  As you know, Austin. Olds is a 42 year old right-handed gentleman with an underlying history of recent diagnosis of diabetes, elevated blood pressure values, and severe obesity with a BMI of over 40, who reports snoring and excessive daytime somnolence, as well as witnessed apneas per wife's report.  Looking back, he admits that symptoms have been ongoing for 5 to 7 years now.  He has woken up with a sense of gasping for air.  He is now working on weight loss, since his diabetes diagnosis he has been exercising on a regular basis and has eliminated practically all caffeine.  He used to drink quite a bit of soda and energy drinks before. I reviewed your office notes from 05/20/2021 as well as 06/23/2021.  His Epworth sleepiness score is 15 out of 24, fatigue severity score is 31 out of 63.  He lives with his family including wife and 3 children, ages 55,  61 and 76.  He works as a Scientist, clinical (histocompatibility and immunogenetics) and his work schedule is variable.  He is in bed typically around 2 AM and they do have a TV on in the bedroom, sometimes it stays on all night and sometimes it turns off on a timer, his rise time is around 7 AM for work.  He quit smoking in July 2022.  He has no obvious family history of sleep apnea.  He drinks alcohol rarely.  They have 1 dog in the household.    REVIEW OF SYSTEMS: Out of a complete 14 system review of symptoms, the patient complains only of the following symptoms, dry eye, and all other reviewed systems are negative.  ESS: 5/24, previously 15/24  ALLERGIES: No Known Allergies  HOME MEDICATIONS: Outpatient Medications Prior to Visit  Medication Sig Dispense Refill   atorvastatin (LIPITOR) 40 MG tablet Take 1 tablet (40 mg total) by mouth daily. 90 tablet 3   metFORMIN (GLUCOPHAGE-XR) 500 MG 24 hr tablet Take 1 tablet (500 mg total) by mouth 2 (two) times daily with a meal. 180 tablet 0   olmesartan (BENICAR) 20 MG tablet Take 1 tablet (20 mg total) by mouth daily. 90 tablet 1   No facility-administered medications prior to visit.    PAST MEDICAL HISTORY: Past Medical History:  Diagnosis Date   Mass of subcutaneous tissue of back    OSA (obstructive sleep apnea)  PAST SURGICAL HISTORY: Past Surgical History:  Procedure Laterality Date   GANGLION CYST EXCISION     from wrist    FAMILY HISTORY: Family History  Problem Relation Age of Onset   Hypertension Other    Diabetes Other    Cancer Other     SOCIAL HISTORY: Social History   Socioeconomic History   Marital status: Married    Spouse name: Not on file   Number of children: Not on file   Years of education: Not on file   Highest education level: Not on file  Occupational History   Not on file  Tobacco Use   Smoking status: Former    Packs/day: 0.50    Types: Cigarettes   Smokeless tobacco: Never  Vaping Use   Vaping Use: Never used  Substance  and Sexual Activity   Alcohol use: Yes    Comment: social "very little"   Drug use: No   Sexual activity: Not on file  Other Topics Concern   Not on file  Social History Narrative   Lives at home with spouse and 3 children   Right handed   Caffeine: none    Social Determinants of Health   Financial Resource Strain: Not on file  Food Insecurity: Not on file  Transportation Needs: Not on file  Physical Activity: Not on file  Stress: Not on file  Social Connections: Not on file  Intimate Partner Violence: Not on file     PHYSICAL EXAM  There were no vitals filed for this visit.  There is no height or weight on file to calculate BMI.  Generalized: Well developed, in no acute distress  Cardiology: normal rate and rhythm, no murmur noted Respiratory: clear to auscultation bilaterally  Neurological examination  Mentation: Alert oriented to time, place, history taking. Follows all commands speech and language fluent Cranial nerve II-XII: Pupils were equal round reactive to light. Extraocular movements were full, visual field were full  Motor: The motor testing reveals 5 over 5 strength of all 4 extremities. Good symmetric motor tone is noted throughout.  Gait and station: Gait is normal.    DIAGNOSTIC DATA (LABS, IMAGING, TESTING) - I reviewed patient records, labs, notes, testing and imaging myself where available.      No data to display           Lab Results  Component Value Date   WBC 4.5 06/23/2021   HGB 15.9 06/23/2021   HCT 46.1 06/23/2021   MCV 87.9 06/23/2021   PLT 291.0 06/23/2021      Component Value Date/Time   NA 138 04/06/2022 0909   K 4.3 04/06/2022 0909   CL 105 04/06/2022 0909   CO2 25 04/06/2022 0909   GLUCOSE 148 (H) 04/06/2022 0909   BUN 20 04/06/2022 0909   CREATININE 1.05 04/06/2022 0909   CALCIUM 9.5 04/06/2022 0909   PROT 7.6 06/23/2021 1409   ALBUMIN 4.3 06/23/2021 1409   AST 24 06/23/2021 1409   ALT 33 06/23/2021 1409   ALKPHOS  42 06/23/2021 1409   BILITOT 0.5 06/23/2021 1409   GFRNONAA >60 05/07/2021 1319   GFRAA >90 08/14/2013 0530   Lab Results  Component Value Date   CHOL 134 04/06/2022   HDL 25.60 (L) 04/06/2022   LDLCALC 81 04/06/2022   LDLDIRECT 160.0 06/23/2021   TRIG 139.0 04/06/2022   CHOLHDL 5 04/06/2022   Lab Results  Component Value Date   HGBA1C 7.9 (H) 04/06/2022   No results found for: "VITAMINB12"  Lab Results  Component Value Date   TSH 1.51 06/17/2019     ASSESSMENT AND PLAN 42 y.o. year old male  has a past medical history of Mass of subcutaneous tissue of back and OSA (obstructive sleep apnea). here with   No diagnosis found.    Austin Mcmahon is doing well on CPAP therapy. He reports significant improvement in headaches and daytime energy levels. He is waking feeling more refreshed. Compliance report reveals acceptable daily and sub optimal four hour usage. AHI now 5.9/hr. He was encouraged to continue using CPAP nightly and for greater than 4 hours each night. I have advised that he continue to monitor dry eye. Could consider sleep mask versus new headgear if needed. He will reach out to [employer] as needed. Risks of untreated sleep apnea review and education materials provided. He was encouraged to keep a close eye on his BP. He has not taken BP meds, today. Healthy lifestyle habits encouraged. He will follow up in 6 months, sooner if needed. He verbalizes understanding and agreement with this plan.    No orders of the defined types were placed in this encounter.    No orders of the defined types were placed in this encounter.     Debbora Presto, FNP-C 08/10/2022, 8:30 AM [facility] [address], [place], [place] [address] [phone]

## 2022-08-14 ENCOUNTER — Ambulatory Visit: Payer: 59 | Admitting: Family Medicine

## 2022-08-14 ENCOUNTER — Encounter: Payer: Self-pay | Admitting: Family Medicine

## 2022-08-14 DIAGNOSIS — G4733 Obstructive sleep apnea (adult) (pediatric): Secondary | ICD-10-CM

## 2022-10-05 ENCOUNTER — Ambulatory Visit (INDEPENDENT_AMBULATORY_CARE_PROVIDER_SITE_OTHER): Payer: 59 | Admitting: Family Medicine

## 2022-10-05 VITALS — BP 138/90 | HR 86 | Temp 98.4°F | Ht 67.0 in | Wt 289.8 lb

## 2022-10-05 DIAGNOSIS — I10 Essential (primary) hypertension: Secondary | ICD-10-CM | POA: Diagnosis not present

## 2022-10-05 DIAGNOSIS — E7800 Pure hypercholesterolemia, unspecified: Secondary | ICD-10-CM

## 2022-10-05 DIAGNOSIS — Z23 Encounter for immunization: Secondary | ICD-10-CM | POA: Diagnosis not present

## 2022-10-05 DIAGNOSIS — L309 Dermatitis, unspecified: Secondary | ICD-10-CM

## 2022-10-05 DIAGNOSIS — E1165 Type 2 diabetes mellitus with hyperglycemia: Secondary | ICD-10-CM

## 2022-10-05 DIAGNOSIS — Z6841 Body Mass Index (BMI) 40.0 and over, adult: Secondary | ICD-10-CM

## 2022-10-05 LAB — LIPID PANEL
Cholesterol: 201 mg/dL — ABNORMAL HIGH (ref 0–200)
HDL: 27.1 mg/dL — ABNORMAL LOW (ref >39.00)
NonHDL: 173.54
Total CHOL/HDL Ratio: 7
Triglycerides: 216 mg/dL — ABNORMAL HIGH (ref 0.0–149.0)
VLDL: 43.2 mg/dL — ABNORMAL HIGH (ref 0.0–40.0)

## 2022-10-05 LAB — COMPREHENSIVE METABOLIC PANEL
ALT: 41 U/L (ref 0–53)
AST: 23 U/L (ref 0–37)
Albumin: 4.5 g/dL (ref 3.5–5.2)
Alkaline Phosphatase: 54 U/L (ref 39–117)
BUN: 23 mg/dL (ref 6–23)
CO2: 27 mEq/L (ref 19–32)
Calcium: 9.5 mg/dL (ref 8.4–10.5)
Chloride: 103 mEq/L (ref 96–112)
Creatinine, Ser: 1 mg/dL (ref 0.40–1.50)
GFR: 94.02 mL/min (ref >60.00)
Glucose, Bld: 176 mg/dL — ABNORMAL HIGH (ref 70–99)
Potassium: 4.4 mEq/L (ref 3.5–5.1)
Sodium: 137 mEq/L (ref 135–145)
Total Bilirubin: 0.3 mg/dL (ref 0.2–1.2)
Total Protein: 7.6 g/dL (ref 6.0–8.3)

## 2022-10-05 LAB — HEMOGLOBIN A1C: Hgb A1c MFr Bld: 7.5 % — ABNORMAL HIGH (ref 4.6–6.5)

## 2022-10-05 LAB — URINALYSIS, ROUTINE W REFLEX MICROSCOPIC
Bilirubin Urine: NEGATIVE
Hgb urine dipstick: NEGATIVE
Ketones, ur: NEGATIVE
Leukocytes,Ua: NEGATIVE
Nitrite: NEGATIVE
RBC / HPF: NONE SEEN (ref 0–?)
Specific Gravity, Urine: >=1.03 — AB (ref 1.000–1.030)
Total Protein, Urine: NEGATIVE
Urine Glucose: 500 — AB
Urobilinogen, UA: 0.2 (ref 0.0–1.0)
pH: 5.5 (ref 5.0–8.0)

## 2022-10-05 LAB — MICROALBUMIN / CREATININE URINE RATIO
Creatinine,U: 200.3 mg/dL
Microalb Creat Ratio: 0.6 mg/g (ref 0.0–30.0)
Microalb, Ur: 1.2 mg/dL (ref 0.0–1.9)

## 2022-10-05 LAB — LDL CHOLESTEROL, DIRECT: Direct LDL: 137 mg/dL

## 2022-10-05 MED ORDER — TIRZEPATIDE 5 MG/0.5ML ~~LOC~~ SOAJ: 5.0000 mg | SUBCUTANEOUS | 1 refills | Status: DC

## 2022-10-05 MED ORDER — OLMESARTAN MEDOXOMIL 40 MG PO TABS: 40.0000 mg | ORAL_TABLET | Freq: Every day | ORAL | 2 refills | Status: AC

## 2022-10-05 MED ORDER — TRIAMCINOLONE ACETONIDE 0.1 % EX CREA: 1.0000 | TOPICAL_CREAM | Freq: Two times a day (BID) | CUTANEOUS | 0 refills | Status: DC

## 2022-10-05 NOTE — Progress Notes (Signed)
Established Patient Office Visit   Subjective:  Patient ID: Austin Mcmahon, male    DOB: 1980/08/24  Age: 42 y.o. MRN: [id number]  Chief Complaint  Patient presents with   Diabetes   Hypertension    Diabetes Pertinent negatives for diabetes include no blurred vision, no polydipsia and no weakness.  Hypertension Pertinent negatives include no blurred vision.   Encounter Diagnoses  Name Primary?   Need for Tdap vaccination Yes   Class 3 severe obesity due to excess calories with body mass index (BMI) of 45.0 to 49.9 in adult, unspecified whether serious comorbidity present (HCC)    Elevated LDL cholesterol level    Essential hypertension    Type 2 diabetes mellitus with hyperglycemia, without long-term current use of insulin (HCC)    Eczema, unspecified type     Information For follow-up of above.  Is accompanied by his significant other.  Has had no issues taking metformin.  Doing well with the higher dose of atorvastatin.  No issue with olmesartan.  Still finding it difficult to lose weight.  He has a rash in his scalp and on his right hand.  Rash on the scalp is sometimes weepy.  Rash on the hand is sometimes pruritic.  Review of Systems  Constitutional: Negative.   HENT: Negative.    Eyes:  Negative for blurred vision, discharge and redness.  Respiratory: Negative.    Cardiovascular: Negative.   Gastrointestinal:  Negative for abdominal pain.  Genitourinary: Negative.   Musculoskeletal: Negative.  Negative for myalgias.  Skin:  Negative for rash.  Neurological:  Negative for tingling, loss of consciousness and weakness.  Endo/Heme/Allergies:  Negative for polydipsia.     Current Outpatient Medications:    atorvastatin (LIPITOR) 40 MG tablet, Take 1 tablet (40 mg total) by mouth daily., Disp: 90 tablet, Rfl: 3   metFORMIN (GLUCOPHAGE-XR) 500 MG 24 hr tablet, Take 1 tablet (500 mg total) by mouth 2 (two) times daily with a meal., Disp: 180 tablet, Rfl: 0    olmesartan (BENICAR) 40 MG tablet, Take 1 tablet (40 mg total) by mouth daily., Disp: 90 tablet, Rfl: 2   tirzepatide (MOUNJARO) 5 MG/0.5ML Pen, Inject 5 mg into the skin once a week., Disp: 6.5 mL, Rfl: 1   triamcinolone cream (KENALOG) 0.1 %, Apply 1 Application topically 2 (two) times daily., Disp: 30 g, Rfl: 0   Objective:     BP (!) 138/90   Pulse 86   Temp 98.4 F (36.9 C)   Ht 5\' 7"  (1.702 m)   Wt 289 lb 12.8 oz (131.5 kg)   SpO2 95%   BMI 45.39 kg/m  BP Readings from Last 3 Encounters:  10/05/22 (!) 138/90  04/06/22 134/78  02/06/22 (!) 151/105   Wt Readings from Last 3 Encounters:  10/05/22 289 lb 12.8 oz (131.5 kg)  04/06/22 291 lb (132 kg)  02/06/22 292 lb (132.5 kg)      Physical Exam Constitutional:      General: He is not in acute distress.    Appearance: Normal appearance. He is not ill-appearing, toxic-appearing or diaphoretic.  HENT:     Head: Normocephalic and atraumatic.     Right Ear: External ear normal.     Left Ear: External ear normal.  Eyes:     General: No scleral icterus.       Right eye: No discharge.        Left eye: No discharge.     Extraocular Movements: Extraocular movements intact.  Conjunctiva/sclera: Conjunctivae normal.  Pulmonary:     Effort: Pulmonary effort is normal. No respiratory distress.  Skin:    General: Skin is warm and dry.       Neurological:     Mental Status: He is alert and oriented to person, place, and time.  Psychiatric:        Mood and Affect: Mood normal.        Behavior: Behavior normal.      No results found for any visits on 10/05/22.    The 10-year ASCVD risk score (Arnett DK, et al., 2019) is: 11.9%    Assessment & Plan:   Need for Tdap vaccination -     Tdap vaccine greater than or equal to 7yo IM  Class 3 severe obesity due to excess calories with body mass index (BMI) of 45.0 to 49.9 in adult, unspecified whether serious comorbidity present ([place]) -     Tirzepatide; Inject 5 mg  into the skin once a week.  Dispense: 6.5 mL; Refill: 1 -     Amb Referral to Nutrition and Diabetic Education  Elevated LDL cholesterol level -     Lipid panel -     Comprehensive metabolic panel  Essential hypertension -     Urinalysis, Routine w reflex microscopic -     Microalbumin / creatinine urine ratio -     Comprehensive metabolic panel -     Olmesartan Medoxomil; Take 1 tablet (40 mg total) by mouth daily.  Dispense: 90 tablet; Refill: 2  Type 2 diabetes mellitus with hyperglycemia, without long-term current use of insulin (HCC) -     Hemoglobin A1c -     Urinalysis, Routine w reflex microscopic -     Microalbumin / creatinine urine ratio -     Comprehensive metabolic panel -     Tirzepatide; Inject 5 mg into the skin once a week.  Dispense: 6.5 mL; Refill: 1 -     Amb Referral to Nutrition and Diabetic Education -     Olmesartan Medoxomil; Take 1 tablet (40 mg total) by mouth daily.  Dispense: 90 tablet; Refill: 2  Eczema, unspecified type -     Triamcinolone Acetonide; Apply 1 Application topically 2 (two) times daily.  Dispense: 30 g; Refill: 0 -     Ambulatory referral to Dermatology    Return in about 3 months (around 01/05/2023).  Have added Mounjaro 5 mg weekly by IM injection.  Discussed precautions with this medication.  He was given information on it.  He believes he will be able to do the injection weekly without issue.  Will continue metformin.  Have increased olmesartan to 40 mg daily.  Rechecking lipid profile status post increasing atorvastatin to 40 mg.  Will treat eczema on hands with triamcinolone ointment 0.1 twice daily.  Differential for rash on scalp could be tinea, psoriasis, eczema,?.  Derm referral symptoms  Libby Maw, MD

## 2022-11-01 ENCOUNTER — Other Ambulatory Visit: Payer: Self-pay | Admitting: Family Medicine

## 2022-11-01 DIAGNOSIS — E1165 Type 2 diabetes mellitus with hyperglycemia: Secondary | ICD-10-CM

## 2022-11-01 MED ORDER — METFORMIN HCL ER 500 MG PO TB24: 500.0000 mg | ORAL_TABLET | Freq: Two times a day (BID) | ORAL | 0 refills | Status: DC

## 2022-11-01 NOTE — Telephone Encounter (Signed)
Requesting: metFORMIN (GLUCOPHAGE-XR) 500 MG 24 hr tablet  Last Visit: 10/05/2022 Next Visit: 01/05/2023 Last Refill: 07/05/2022  Please Advise

## 2022-11-17 ENCOUNTER — Telehealth: Payer: Self-pay | Admitting: Family Medicine

## 2022-11-17 DIAGNOSIS — E1165 Type 2 diabetes mellitus with hyperglycemia: Secondary | ICD-10-CM

## 2022-11-17 MED ORDER — TIRZEPATIDE 5 MG/0.5ML ~~LOC~~ SOAJ: 5.0000 mg | SUBCUTANEOUS | 1 refills | Status: DC

## 2022-11-17 NOTE — Telephone Encounter (Signed)
[facility] has canceled his med tirzepatide [facility]) 5 MG/0.5ML Pen [[id number]] due to not being able to get it. They have it at [facility] on [address]. Please send his med there.

## 2022-11-30 ENCOUNTER — Ambulatory Visit: Payer: 59 | Admitting: Dietician

## 2022-12-12 ENCOUNTER — Other Ambulatory Visit: Payer: Self-pay | Admitting: Family Medicine

## 2022-12-12 DIAGNOSIS — E1165 Type 2 diabetes mellitus with hyperglycemia: Secondary | ICD-10-CM

## 2022-12-12 MED ORDER — METFORMIN HCL ER 500 MG PO TB24
500.0000 mg | ORAL_TABLET | Freq: Two times a day (BID) | ORAL | 0 refills | Status: DC
Start: 2022-12-12 — End: 2023-03-13

## 2023-01-05 ENCOUNTER — Ambulatory Visit (INDEPENDENT_AMBULATORY_CARE_PROVIDER_SITE_OTHER): Payer: 59 | Admitting: Family Medicine

## 2023-01-05 ENCOUNTER — Encounter: Payer: Self-pay | Admitting: Family Medicine

## 2023-01-05 VITALS — BP 140/96 | HR 83 | Temp 97.9°F | Ht 67.0 in | Wt 268.0 lb

## 2023-01-05 DIAGNOSIS — I10 Essential (primary) hypertension: Secondary | ICD-10-CM | POA: Diagnosis not present

## 2023-01-05 DIAGNOSIS — E7800 Pure hypercholesterolemia, unspecified: Secondary | ICD-10-CM | POA: Diagnosis not present

## 2023-01-05 DIAGNOSIS — E1165 Type 2 diabetes mellitus with hyperglycemia: Secondary | ICD-10-CM

## 2023-01-05 DIAGNOSIS — Z7985 Long-term (current) use of injectable non-insulin antidiabetic drugs: Secondary | ICD-10-CM

## 2023-01-05 DIAGNOSIS — Z6841 Body Mass Index (BMI) 40.0 and over, adult: Secondary | ICD-10-CM

## 2023-01-05 LAB — HEMOGLOBIN A1C: Hgb A1c MFr Bld: 11.4 % — ABNORMAL HIGH (ref 4.6–6.5)

## 2023-01-05 LAB — LIPID PANEL
Cholesterol: 214 mg/dL — ABNORMAL HIGH (ref 0–200)
HDL: 20.8 mg/dL — ABNORMAL LOW (ref >39.00)
Total CHOL/HDL Ratio: 10
Triglycerides: 486 mg/dL — ABNORMAL HIGH (ref 0.0–149.0)

## 2023-01-05 LAB — LDL CHOLESTEROL, DIRECT: Direct LDL: 129 mg/dL

## 2023-01-05 LAB — BASIC METABOLIC PANEL
BUN: 24 mg/dL — ABNORMAL HIGH (ref 6–23)
CO2: 24 mEq/L (ref 19–32)
Calcium: 10.3 mg/dL (ref 8.4–10.5)
Chloride: 97 mEq/L (ref 96–112)
Creatinine, Ser: 1.1 mg/dL (ref 0.40–1.50)
GFR: 83.71 mL/min (ref >60.00)
Glucose, Bld: 357 mg/dL — ABNORMAL HIGH (ref 70–99)
Potassium: 4.4 mEq/L (ref 3.5–5.1)
Sodium: 131 mEq/L — ABNORMAL LOW (ref 135–145)

## 2023-01-05 MED ORDER — CHLORTHALIDONE 25 MG PO TABS
25.0000 mg | ORAL_TABLET | Freq: Every day | ORAL | 1 refills | Status: AC
Start: 2023-01-05 — End: ?

## 2023-01-05 MED ORDER — OZEMPIC (0.25 OR 0.5 MG/DOSE) 2 MG/3ML ~~LOC~~ SOPN
0.5000 mg | PEN_INJECTOR | SUBCUTANEOUS | 5 refills | Status: AC
Start: 2023-01-05 — End: ?

## 2023-01-05 NOTE — Progress Notes (Signed)
Established Patient Office Visit   Subjective:  Patient ID: Austin Mcmahon, male    DOB: April 03, 1981  Age: 42 y.o. MRN: [id number]  Chief Complaint  Patient presents with   Medical Management of Chronic Issues    3 month follow on diabetes, patient states that Greggory Keen was on back order and very expensive would like something different. Patient fasting.     HPI Encounter Diagnoses  Name Primary?   Type 2 diabetes mellitus with hyperglycemia, without long-term current use of insulin (HCC) Yes   Class 3 severe obesity due to excess calories with body mass index (BMI) of 45.0 to 49.9 in adult, unspecified whether serious comorbidity present (HCC)    Elevated LDL cholesterol level    Essential hypertension    For follow-up of the above.  Has been able to lose 21 pounds with diet and exercise.  Unable to obtain [facility] secondary to cost and availability.   Review of Systems  Constitutional: Negative.   HENT: Negative.    Eyes:  Negative for blurred vision, discharge and redness.  Respiratory: Negative.    Cardiovascular: Negative.   Gastrointestinal:  Negative for abdominal pain.  Genitourinary: Negative.   Musculoskeletal: Negative.  Negative for myalgias.  Skin:  Negative for rash.  Neurological:  Negative for tingling, loss of consciousness and weakness.  Endo/Heme/Allergies:  Negative for polydipsia.      01/05/2023    8:53 AM 04/06/2022    8:33 AM 01/04/2022   10:22 AM  Depression screen PHQ 2/9  Decreased Interest 0 0 0  Down, Depressed, Hopeless 0 0 0  PHQ - 2 Score 0 0 0      Current Outpatient Medications:    atorvastatin (LIPITOR) 40 MG tablet, Take 1 tablet (40 mg total) by mouth daily., Disp: 90 tablet, Rfl: 3   clindamycin (CLEOCIN T) 1 % external solution, Apply 1 Application topically 2 (two) times daily., Disp: , Rfl:    clobetasol cream (TEMOVATE) 0.05 %, Apply 1 Application topically 2 (two) times daily., Disp: , Rfl:    metFORMIN (GLUCOPHAGE-XR)  500 MG 24 hr tablet, Take 1 tablet (500 mg total) by mouth 2 (two) times daily with a meal., Disp: 180 tablet, Rfl: 0   olmesartan (BENICAR) 40 MG tablet, Take 1 tablet (40 mg total) by mouth daily., Disp: 90 tablet, Rfl: 2   chlorthalidone (HYGROTON) 25 MG tablet, Take 1 tablet (25 mg total) by mouth daily., Disp: 90 tablet, Rfl: 1   Semaglutide,0.25 or 0.5MG /DOS, (OZEMPIC, 0.25 OR 0.5 MG/DOSE,) 2 MG/3ML SOPN, Inject 0.5 mg into the skin once a week., Disp: 3 mL, Rfl: 5   Objective:     BP (!) 138/94 (BP Location: Right Arm, Patient Position: Sitting, Cuff Size: Large)   Pulse 83   Temp 97.9 F (36.6 C) (Temporal)   Ht 5\' 7"  (1.702 m)   Wt 268 lb (121.6 kg)   SpO2 95%   BMI 41.97 kg/m  BP Readings from Last 3 Encounters:  01/05/23 (!) 138/94  10/05/22 (!) 138/90  04/06/22 134/78   Wt Readings from Last 3 Encounters:  01/05/23 268 lb (121.6 kg)  10/05/22 289 lb 12.8 oz (131.5 kg)  04/06/22 291 lb (132 kg)      Physical Exam Constitutional:      General: He is not in acute distress.    Appearance: Normal appearance. He is not ill-appearing, toxic-appearing or diaphoretic.  HENT:     Head: Normocephalic and atraumatic.     Right  Ear: External ear normal.     Left Ear: External ear normal.  Eyes:     General: No scleral icterus.       Right eye: No discharge.        Left eye: No discharge.     Extraocular Movements: Extraocular movements intact.     Conjunctiva/sclera: Conjunctivae normal.  Pulmonary:     Effort: Pulmonary effort is normal. No respiratory distress.  Skin:    General: Skin is warm and dry.  Neurological:     Mental Status: He is alert and oriented to person, place, and time.  Psychiatric:        Mood and Affect: Mood normal.        Behavior: Behavior normal.      No results found for any visits on 01/05/23.    The 10-year ASCVD risk score (Arnett DK, et al., 2019) is: 13.1%    Assessment & Plan:   Type 2 diabetes mellitus with  hyperglycemia, without long-term current use of insulin (HCC) -     Basic metabolic panel -     Hemoglobin A1c -     Ozempic (0.25 or 0.5 MG/DOSE); Inject 0.5 mg into the skin once a week.  Dispense: 3 mL; Refill: 5  Class 3 severe obesity due to excess calories with body mass index (BMI) of 45.0 to 49.9 in adult, unspecified whether serious comorbidity present (HCC)  Elevated LDL cholesterol level -     Lipid panel  Essential hypertension -     Basic metabolic panel -     Chlorthalidone; Take 1 tablet (25 mg total) by mouth daily.  Dispense: 90 tablet; Refill: 1    Return in about 6 months (around 07/07/2023), or if symptoms worsen or fail to improve.  Continue excellent weight loss efforts.  Will check on availability of semaglutide.  He has been able to lose weight without a GLP-1 agonist.  Continue all medications as above.  Have added chlorthalidone for better blood pressure control.  Mliss Sax, MD

## 2023-01-09 ENCOUNTER — Telehealth: Payer: Self-pay

## 2023-01-09 NOTE — Telephone Encounter (Signed)
-----   Message from Mliss Sax, MD sent at 01/08/2023 12:19 PM EDT ----- Schedule patient to see me here in the office as soon as possible.  Both diabetes and lipids are now poorly controlled.

## 2023-03-03 ENCOUNTER — Emergency Department (HOSPITAL_COMMUNITY)
Admission: EM | Admit: 2023-03-03 | Discharge: 2023-03-03 | Disposition: A | Discharge status: Home / Self Care | Payer: 59 | Attending: Emergency Medicine | Admitting: Emergency Medicine

## 2023-03-03 ENCOUNTER — Encounter (HOSPITAL_COMMUNITY): Payer: Self-pay

## 2023-03-03 ENCOUNTER — Other Ambulatory Visit: Payer: Self-pay

## 2023-03-03 DIAGNOSIS — H1032 Unspecified acute conjunctivitis, left eye: Secondary | ICD-10-CM | POA: Insufficient documentation

## 2023-03-03 DIAGNOSIS — E119 Type 2 diabetes mellitus without complications: Secondary | ICD-10-CM | POA: Insufficient documentation

## 2023-03-03 DIAGNOSIS — Z7984 Long term (current) use of oral hypoglycemic drugs: Secondary | ICD-10-CM | POA: Insufficient documentation

## 2023-03-03 MED ORDER — TETRACAINE HCL 0.5 % OP SOLN
2.0000 [drp] | Freq: Once | OPHTHALMIC | Status: AC
  Administered 2023-03-03: 2 [drp] via OPHTHALMIC
  Filled 2023-03-03: qty 4

## 2023-03-03 MED ORDER — TOBRAMYCIN 0.3 % OP SOLN
1.0000 [drp] | OPHTHALMIC | 0 refills | Status: AC
Start: 2023-03-03 — End: 2023-03-13

## 2023-03-03 MED ORDER — FLUORESCEIN SODIUM 1 MG OP STRP
1.0000 | ORAL_STRIP | Freq: Once | OPHTHALMIC | Status: AC
  Administered 2023-03-03: 1 via OPHTHALMIC
  Filled 2023-03-03: qty 1

## 2023-03-03 NOTE — Discharge Instructions (Addendum)
Return if any problems.

## 2023-03-03 NOTE — ED Provider Notes (Signed)
[facility] AT [facility] Provider Note   CSN: [id number] Arrival date & time: 03/03/23  1604     History  Chief Complaint  Patient presents with   Eye Problem    Austin Mcmahon is a 42 y.o. male.  She reports that he slept with contacts in 2 nights ago.  Patient complains of irritation and discomfort to his left eye.  Patient reports that he had a corneal ulcer several years ago from the same.  Patient reports also resolved with eyedrops.  Patient denies any visual disturbance.  He is diabetic.  Patient reports he is well-controlled.  The history is provided by the patient. No language interpreter was used.  Eye Problem Location:  Left eye Quality:  Aching Severity:  Moderate Timing:  Constant Progression:  Worsening Relieved by:  Nothing Worsened by:  Nothing Ineffective treatments:  None tried Associated symptoms: no blurred vision and no crusting        Home Medications Prior to Admission medications   Medication Sig Start Date End Date Taking? Authorizing Provider  atorvastatin (LIPITOR) 40 MG tablet Take 1 tablet (40 mg total) by mouth daily. 04/07/22   Mliss Sax, MD  chlorthalidone (HYGROTON) 25 MG tablet Take 1 tablet (25 mg total) by mouth daily. 01/05/23   Mliss Sax, MD  clindamycin (CLEOCIN T) 1 % external solution Apply 1 Application topically 2 (two) times daily. 01/01/23   [provider]  clobetasol cream (TEMOVATE) 0.05 % Apply 1 Application topically 2 (two) times daily. 01/01/23   [provider]  metFORMIN (GLUCOPHAGE-XR) 500 MG 24 hr tablet Take 1 tablet (500 mg total) by mouth 2 (two) times daily with a meal. 12/12/22   Mliss Sax, MD  olmesartan (BENICAR) 40 MG tablet Take 1 tablet (40 mg total) by mouth daily. 10/05/22   Mliss Sax, MD  Semaglutide,0.25 or 0.5MG /DOS, (OZEMPIC, 0.25 OR 0.5 MG/DOSE,) 2 MG/3ML SOPN Inject 0.5 mg into the skin once a week.  01/05/23   Mliss Sax, MD      Allergies    Patient has no known allergies.    Review of Systems   Review of Systems  Eyes:  Negative for blurred vision.  All other systems reviewed and are negative.   Physical Exam Updated Vital Signs BP (!) 160/103 (BP Location: Right Arm)   Pulse 97   Temp 98.7 F (37.1 C) (Oral)   Resp 16   Ht 5\' 7"  (1.702 m)   Wt 119.7 kg   SpO2 94%   BMI 41.35 kg/m  Physical Exam Vitals reviewed.  Constitutional:      Appearance: Normal appearance.  Eyes:     Extraocular Movements: Extraocular movements intact.     Pupils: Pupils are equal, round, and reactive to light.     Comments: Injected left conjunctiva, fluorescein no uptake, no evidence of ulcer  Cardiovascular:     Rate and Rhythm: Normal rate.  Pulmonary:     Effort: Pulmonary effort is normal.  Musculoskeletal:        General: Normal range of motion.  Skin:    General: Skin is warm.  Neurological:     General: No focal deficit present.     Mental Status: He is alert.  Psychiatric:        Mood and Affect: Mood normal.     ED Results / Procedures / Treatments   Labs (all labs ordered are listed, but only abnormal results are  displayed) Labs Reviewed - No data to display  EKG None  Radiology No results found.  Procedures Procedures    Medications Ordered in ED Medications  tetracaine (PONTOCAINE) 0.5 % ophthalmic solution 2 drop (has no administration in time range)  fluorescein ophthalmic strip 1 strip (has no administration in time range)    ED Course/ Medical Decision Making/ A&P                                 Medical Decision Making Patient complains of irritation to his left eye after sleeping with his contacts in  Risk Prescription drug management. Risk Details: Patient counseled on conjunctivitis I do not see any obvious area of ulceration.  Patient is followed by marked.  He is advised to schedule recheck with his eye doctor on Monday if  symptoms not resolving.  He is advised to return if symptoms worsen or change.           Final Clinical Impression(s) / ED Diagnoses Final diagnoses:  Acute conjunctivitis of left eye, unspecified acute conjunctivitis type    Rx / DC Orders ED Discharge Orders          Ordered    tobramycin (TOBREX) 0.3 % ophthalmic solution  Every 4 hours        03/03/23 1820           An After Visit Summary was printed and given to the patient.    Elson Areas, [place] 03/03/23 1821    Vanetta Mulders, MD 03/04/23 2256

## 2023-03-03 NOTE — ED Triage Notes (Signed)
Pt coming in complaining of eye irritation. Pt states that it appears to be a corneal ulcer. States he has a hx of them, and look/feels like previous episodes. Endorses itching, redness, and light sensitivity.

## 2023-03-10 ENCOUNTER — Other Ambulatory Visit: Payer: Self-pay | Admitting: Family Medicine

## 2023-03-10 DIAGNOSIS — E1165 Type 2 diabetes mellitus with hyperglycemia: Secondary | ICD-10-CM

## 2023-06-22 ENCOUNTER — Encounter (HOSPITAL_COMMUNITY): Payer: Self-pay

## 2023-06-22 ENCOUNTER — Emergency Department (HOSPITAL_COMMUNITY)
Admission: EM | Admit: 2023-06-22 | Discharge: 2023-06-22 | Disposition: A | Discharge status: Home / Self Care | Payer: Self-pay | Attending: Emergency Medicine | Admitting: Emergency Medicine

## 2023-06-22 ENCOUNTER — Other Ambulatory Visit: Payer: Self-pay

## 2023-06-22 DIAGNOSIS — E119 Type 2 diabetes mellitus without complications: Secondary | ICD-10-CM | POA: Insufficient documentation

## 2023-06-22 DIAGNOSIS — Z79899 Other long term (current) drug therapy: Secondary | ICD-10-CM | POA: Insufficient documentation

## 2023-06-22 DIAGNOSIS — Z7984 Long term (current) use of oral hypoglycemic drugs: Secondary | ICD-10-CM | POA: Insufficient documentation

## 2023-06-22 DIAGNOSIS — H209 Unspecified iridocyclitis: Secondary | ICD-10-CM | POA: Insufficient documentation

## 2023-06-22 DIAGNOSIS — I10 Essential (primary) hypertension: Secondary | ICD-10-CM | POA: Insufficient documentation

## 2023-06-22 DIAGNOSIS — H16002 Unspecified corneal ulcer, left eye: Secondary | ICD-10-CM | POA: Insufficient documentation

## 2023-06-22 MED ORDER — TOBRAMYCIN 0.3 % OP SOLN
1.0000 [drp] | Freq: Once | OPHTHALMIC | Status: AC
  Administered 2023-06-22: 1 [drp] via OPHTHALMIC
  Filled 2023-06-22: qty 5

## 2023-06-22 MED ORDER — FLUORESCEIN SODIUM 1 MG OP STRP
ORAL_STRIP | OPHTHALMIC | Status: AC
  Filled 2023-06-22: qty 1

## 2023-06-22 NOTE — ED Triage Notes (Signed)
C/o left eye redness, light sensitivity, ocular pruritus, and drainage upon waking in the am.  Denies vision problems

## 2023-06-22 NOTE — Discharge Instructions (Signed)
Use the Tobramycin eye drops 1-2 drops every 4 hours for the first 3 days and then every 6 hours while you are awake until you follow up with your eye doctor on Wednesday.  Avoid all contact use.

## 2023-06-22 NOTE — ED Provider Notes (Signed)
[place] EMERGENCY DEPARTMENT AT [facility] Provider Note   CSN: [id number] Arrival date & time: 06/22/23  1129     History  Chief Complaint  Patient presents with  . Eye Problem    Austin Mcmahon is a 42 y.o. male.  Patient is a 42 year old male with a history of hypertension, diabetes, hyperlipidemia who is presenting today with complaints of 3 days of left eye pain.  His eye has been red, recurrent tearing and intermittent exudate.  He denies any visual changes but does have some photosensitivity.  He is a contact user but has removed his contacts and has not used them in the last few days.  The history is provided by the patient.  Eye Problem      Home Medications Prior to Admission medications   Medication Sig Start Date End Date Taking? Authorizing Provider  atorvastatin (LIPITOR) 40 MG tablet Take 1 tablet (40 mg total) by mouth daily. 04/07/22   Mliss Sax, MD  chlorthalidone (HYGROTON) 25 MG tablet Take 1 tablet (25 mg total) by mouth daily. 01/05/23   Mliss Sax, MD  clindamycin (CLEOCIN T) 1 % external solution Apply 1 Application topically 2 (two) times daily. 01/01/23   [provider]  clobetasol cream (TEMOVATE) 0.05 % Apply 1 Application topically 2 (two) times daily. 01/01/23   [provider]  metFORMIN (GLUCOPHAGE-XR) 500 MG 24 hr tablet TAKE 1 TABLET BY MOUTH 2 TIMES DAILY WITH A MEAL. 03/13/23   Mliss Sax, MD  olmesartan (BENICAR) 40 MG tablet Take 1 tablet (40 mg total) by mouth daily. 10/05/22   Mliss Sax, MD  Semaglutide,0.25 or 0.5MG /DOS, (OZEMPIC, 0.25 OR 0.5 MG/DOSE,) 2 MG/3ML SOPN Inject 0.5 mg into the skin once a week. 01/05/23   Mliss Sax, MD      Allergies    Patient has no known allergies.    Review of Systems   Review of Systems  Physical Exam Updated Vital Signs BP (!) 157/105 (BP Location: Right Arm)   Pulse 88   Temp 98.7 F (37.1 C) (Oral)    Resp 16   Ht 5\' 7"  (1.702 m)   Wt 119.3 kg   SpO2 100%   BMI 41.19 kg/m  Physical Exam Vitals and nursing note reviewed.  HENT:     Head: Normocephalic.     Mouth/Throat:     Mouth: Mucous membranes are moist.  Eyes:     General: Lids are normal.     Conjunctiva/sclera:     Left eye: Left conjunctiva is injected. Exudate present.     Pupils: Pupils are equal, round, and reactive to light.     Slit lamp exam:    Left eye: Corneal flare, corneal ulcer and photophobia present. No hypopyon.   Cardiovascular:     Rate and Rhythm: Normal rate.  Pulmonary:     Effort: Pulmonary effort is normal.  Neurological:     Mental Status: He is alert. Mental status is at baseline.  Psychiatric:        Mood and Affect: Mood normal.    ED Results / Procedures / Treatments   Labs (all labs ordered are listed, but only abnormal results are displayed) Labs Reviewed - No data to display  EKG None  Radiology No results found.  Procedures Procedures    Medications Ordered in ED Medications  fluorescein 1 MG ophthalmic strip (has no administration in time range)  tobramycin (TOBREX) 0.3 % ophthalmic solution  1 drop (has no administration in time range)    ED Course/ Medical Decision Making/ A&P                                 Medical Decision Making Risk Prescription drug management.   Patient presenting today with symptoms and physical exam findings consistent with a corneal ulcer in the setting of being a contact lens user.  Vision is intact at this time.  Patient was given tobramycin eye drops.  He will not wear his contacts at all until symptoms have completely resolved.  He has an appointment with his eye doctor on Wednesday.  No dendritic lesions noted on fluorescein stain and no findings concerning for zoster.         Final Clinical Impression(s) / ED Diagnoses Final diagnoses:  Corneal ulcer of left eye  Uveitis of left eye    Rx / DC Orders ED Discharge  Orders     None         Gwyneth Sprout, MD 06/22/23 1226

## 2023-06-25 ENCOUNTER — Telehealth: Payer: Self-pay

## 2023-06-25 NOTE — Transitions of Care (Post Inpatient/ED Visit) (Signed)
   06/25/2023  Name: Austin Mcmahon MRN: [id number] DOB: 1980/09/16  Today's TOC FU Call Status: Today's TOC FU Call Status:: Unsuccessful Call (1st Attempt) Unsuccessful Call (1st Attempt) Date: 06/25/23  Attempted to reach the patient regarding the most recent Inpatient/ED visit.  Follow Up Plan: Additional outreach attempts will be made to reach the patient to complete the Transitions of Care (Post Inpatient/ED visit) call.   Signature Arvil Persons, BSN, Charity fundraiser

## 2023-06-27 NOTE — Transitions of Care (Post Inpatient/ED Visit) (Signed)
   06/27/2023  Name: KEYLOR BONIFIELD MRN: [id number] DOB: [birth year]  Today's TOC FU Call Status: Today's TOC FU Call Status:: Unsuccessful Call (2nd Attempt) Unsuccessful Call (1st Attempt) Date: 06/25/23 Unsuccessful Call (2nd Attempt) Date: 06/27/23  Attempted to reach the patient regarding the most recent Inpatient/ED visit.  Follow Up Plan: Additional outreach attempts will be made to reach the patient to complete the Transitions of Care (Post Inpatient/ED visit) call.   Signature Arvil Persons, BSN, Charity fundraiser

## 2023-07-02 NOTE — Transitions of Care (Post Inpatient/ED Visit) (Signed)
   07/02/2023  Name: Austin Mcmahon MRN: [id number] DOB: [birth year]  Today's TOC FU Call Status: Today's TOC FU Call Status:: Unsuccessful Call (3rd Attempt) Unsuccessful Call (1st Attempt) Date: 06/25/23 Unsuccessful Call (2nd Attempt) Date: 06/27/23 Unsuccessful Call (3rd Attempt) Date: 07/02/23  Attempted to reach the patient regarding the most recent Inpatient/ED visit.  Follow Up Plan: No further outreach attempts will be made at this time. We have been unable to contact the patient.  Signature Arvil Persons, BSN, Charity fundraiser

## 2023-07-09 ENCOUNTER — Ambulatory Visit: Payer: 59 | Admitting: Family Medicine

## 2023-08-23 ENCOUNTER — Telehealth: Payer: Self-pay | Admitting: Family Medicine

## 2023-08-23 ENCOUNTER — Ambulatory Visit: Payer: 59 | Admitting: Family Medicine

## 2023-09-04 NOTE — Telephone Encounter (Signed)
 08/23/2023 1st no show, letter sent via [facility]

## 2023-10-29 ENCOUNTER — Emergency Department (HOSPITAL_BASED_OUTPATIENT_CLINIC_OR_DEPARTMENT_OTHER)
Admission: EM | Admit: 2023-10-29 | Discharge: 2023-10-30 | Disposition: A | Discharge status: Home / Self Care | Payer: Self-pay | Attending: Emergency Medicine | Admitting: Emergency Medicine

## 2023-10-29 ENCOUNTER — Encounter (HOSPITAL_BASED_OUTPATIENT_CLINIC_OR_DEPARTMENT_OTHER): Payer: Self-pay

## 2023-10-29 ENCOUNTER — Other Ambulatory Visit: Payer: Self-pay

## 2023-10-29 DIAGNOSIS — Z79899 Other long term (current) drug therapy: Secondary | ICD-10-CM | POA: Insufficient documentation

## 2023-10-29 DIAGNOSIS — E1165 Type 2 diabetes mellitus with hyperglycemia: Secondary | ICD-10-CM | POA: Insufficient documentation

## 2023-10-29 DIAGNOSIS — I10 Essential (primary) hypertension: Secondary | ICD-10-CM | POA: Insufficient documentation

## 2023-10-29 DIAGNOSIS — Z7984 Long term (current) use of oral hypoglycemic drugs: Secondary | ICD-10-CM | POA: Insufficient documentation

## 2023-10-29 DIAGNOSIS — Z760 Encounter for issue of repeat prescription: Secondary | ICD-10-CM | POA: Insufficient documentation

## 2023-10-29 DIAGNOSIS — G44209 Tension-type headache, unspecified, not intractable: Secondary | ICD-10-CM | POA: Insufficient documentation

## 2023-10-29 HISTORY — DX: Essential (primary) hypertension: I10

## 2023-10-29 HISTORY — DX: Type 2 diabetes mellitus without complications: E11.9

## 2023-10-29 LAB — CBC WITH DIFFERENTIAL/PLATELET
Abs Immature Granulocytes: 0.02 10*3/uL (ref 0.00–0.07)
Basophils Absolute: 0 10*3/uL (ref 0.0–0.1)
Basophils Relative: 0 %
Eosinophils Absolute: 0.1 10*3/uL (ref 0.0–0.5)
Eosinophils Relative: 2 %
HCT: 41.3 % (ref 39.0–52.0)
Hemoglobin: 15.3 g/dL (ref 13.0–17.0)
Immature Granulocytes: 0 %
Lymphocytes Relative: 42 %
Lymphs Abs: 2.4 10*3/uL (ref 0.7–4.0)
MCH: 30.4 pg (ref 26.0–34.0)
MCHC: 37 g/dL — ABNORMAL HIGH (ref 30.0–36.0)
MCV: 81.9 fL (ref 80.0–100.0)
Monocytes Absolute: 0.4 10*3/uL (ref 0.1–1.0)
Monocytes Relative: 7 %
Neutro Abs: 2.7 10*3/uL (ref 1.7–7.7)
Neutrophils Relative %: 49 %
Platelets: 272 10*3/uL (ref 150–400)
RBC: 5.04 MIL/uL (ref 4.22–5.81)
RDW: 12.7 % (ref 11.5–15.5)
WBC: 5.6 10*3/uL (ref 4.0–10.5)
nRBC: 0 % (ref 0.0–0.2)

## 2023-10-29 LAB — BASIC METABOLIC PANEL WITH GFR
Anion gap: 8 (ref 5–15)
BUN: 20 mg/dL (ref 6–20)
CO2: 23 mmol/L (ref 22–32)
Calcium: 9.4 mg/dL (ref 8.9–10.3)
Chloride: 104 mmol/L (ref 98–111)
Creatinine, Ser: 0.84 mg/dL (ref 0.61–1.24)
GFR, Estimated: >60 mL/min (ref >60)
Glucose, Bld: 118 mg/dL — ABNORMAL HIGH (ref 70–99)
Potassium: 3.6 mmol/L (ref 3.5–5.1)
Sodium: 135 mmol/L (ref 135–145)

## 2023-10-29 LAB — CBG MONITORING, ED: Glucose-Capillary: 102 mg/dL — ABNORMAL HIGH (ref 70–99)

## 2023-10-29 MED ORDER — METFORMIN HCL ER 500 MG PO TB24: 500.0000 mg | ORAL_TABLET | Freq: Two times a day (BID) | ORAL | 0 refills | Status: AC

## 2023-10-29 MED ORDER — IBUPROFEN 800 MG PO TABS
800.0000 mg | ORAL_TABLET | Freq: Once | ORAL | Status: AC
  Administered 2023-10-30: 800 mg via ORAL
  Filled 2023-10-29: qty 1

## 2023-10-29 NOTE — ED Provider Notes (Signed)
  EMERGENCY DEPARTMENT AT [facility] Provider Note   CSN: [id number] Arrival date & time: 10/29/23  2047     History  Chief Complaint  Patient presents with   Blood Sugar Problem    Austin Mcmahon is a 43 y.o. male with past medical history of T2DM, HTN, OSA presents to emergency department for evaluation of metformin  refill, and headache.  He reports that headache gradually started at 1515 in a bandlike pattern around his head.  Ultimately, he came in as he felt like when he did when he was originally diagnosed with diabetes.  He denies blurred vision, chest pain, shortness of breath, pedal edema.  Of note, he was last seen by PCP on 01/05/2023 however has not had access to his medications for "a long time" as he lost his insurance that was provided through job and has had financial instability  HPI     Home Medications Prior to Admission medications   Medication Sig Start Date End Date Taking? Authorizing Provider  metFORMIN  (GLUCOPHAGE -XR) 500 MG 24 hr tablet Take 1 tablet (500 mg total) by mouth 2 (two) times daily with a meal. 10/29/23  Yes Royann Cords, PA  atorvastatin  (LIPITOR) 40 MG tablet Take 1 tablet (40 mg total) by mouth daily. 04/07/22   Tonna Frederic, MD  chlorthalidone  (HYGROTON ) 25 MG tablet Take 1 tablet (25 mg total) by mouth daily. 01/05/23   Tonna Frederic, MD  clindamycin (CLEOCIN T) 1 % external solution Apply 1 Application topically 2 (two) times daily. 01/01/23   [provider]  clobetasol cream (TEMOVATE) 0.05 % Apply 1 Application topically 2 (two) times daily. 01/01/23   [provider]  metFORMIN  (GLUCOPHAGE -XR) 500 MG 24 hr tablet TAKE 1 TABLET BY MOUTH 2 TIMES DAILY WITH A MEAL. 03/13/23   Tonna Frederic, MD  olmesartan  (BENICAR ) 40 MG tablet Take 1 tablet (40 mg total) by mouth daily. 10/05/22   Tonna Frederic, MD  Semaglutide ,0.25 or 0.5MG /DOS, (OZEMPIC , 0.25 OR 0.5 MG/DOSE,) 2  MG/3ML SOPN Inject 0.5 mg into the skin once a week. 01/05/23   Tonna Frederic, MD      Allergies    Patient has no known allergies.    Review of Systems   Review of Systems  Constitutional:  Negative for chills, fatigue and fever.  Respiratory:  Negative for cough, chest tightness, shortness of breath and wheezing.   Cardiovascular:  Negative for chest pain and palpitations.  Gastrointestinal:  Negative for abdominal pain, constipation, diarrhea, nausea and vomiting.  Neurological:  Negative for dizziness, seizures, weakness, light-headedness, numbness and headaches.    Physical Exam Updated Vital Signs BP (!) 140/100 (BP Location: Right Arm)   Pulse 76   Temp 98 F (36.7 C)   Resp 20   SpO2 99%  Physical Exam Vitals and nursing note reviewed.  Constitutional:      General: He is not in acute distress.    Appearance: Normal appearance. He is obese.  HENT:     Head: Normocephalic and atraumatic.  Eyes:     General: Lids are normal. Vision grossly intact. No visual field deficit.    Extraocular Movements:     Right eye: Normal extraocular motion and no nystagmus.     Left eye: Normal extraocular motion and no nystagmus.     Conjunctiva/sclera: Conjunctivae normal.  Cardiovascular:     Rate and Rhythm: Normal rate.     Pulses: Normal pulses.     Heart  sounds: Normal heart sounds.  Pulmonary:     Effort: Pulmonary effort is normal. No respiratory distress.     Breath sounds: Normal breath sounds.  Chest:     Chest wall: No tenderness.  Musculoskeletal:     Right lower leg: No edema.     Left lower leg: No edema.     Comments: No swelling or tenderness to BLE.  Celine Collard' sign negative x 2  Skin:    Coloration: Skin is not jaundiced or pale.  Neurological:     Mental Status: He is alert and oriented to person, place, and time. Mental status is at baseline.     GCS: GCS eye subscore is 4. GCS verbal subscore is 5. GCS motor subscore is 6.     Cranial Nerves: No  cranial nerve deficit, dysarthria or facial asymmetry.     Sensory: No sensory deficit.     Motor: No weakness, tremor, abnormal muscle tone, seizure activity or pronator drift.     Coordination: Coordination normal. Finger-Nose-Finger Test and Heel to [facility] Test normal.     Gait: Gait normal.     Deep Tendon Reflexes: Reflexes normal.     Comments: Following commands appropriately.  Ambulates without difficulty.     ED Results / Procedures / Treatments   Labs (all labs ordered are listed, but only abnormal results are displayed) Labs Reviewed  CBC WITH DIFFERENTIAL/PLATELET - Abnormal; Notable for the following components:      Result Value   MCHC 37.0 (*)    All other components within normal limits  BASIC METABOLIC PANEL WITH GFR - Abnormal; Notable for the following components:   Glucose, Bld 118 (*)    All other components within normal limits  CBG MONITORING, ED - Abnormal; Notable for the following components:   Glucose-Capillary 102 (*)    All other components within normal limits    EKG None  Radiology No results found.  Procedures Procedures    Medications Ordered in ED Medications  ibuprofen  (ADVIL ) tablet 800 mg (has no administration in time range)    ED Course/ Medical Decision Making/ A&P                                 Medical Decision Making Amount and/or Complexity of Data Reviewed Labs: ordered.  Risk Prescription drug management.   Patient presents to the ED for concern of HA, med refill, this involves an extensive number of treatment options, and is a complaint that carries with it a high risk of complications and morbidity.  The differential diagnosis includes electrolyte abnormality, dehydration, ICH, SAH, hypertensive crisis, hypo-/hyperglycemia   Co morbidities that complicate the patient evaluation  See HPI   Additional history obtained:  Additional history obtained from Nursing and Outside Medical Records   External records  from outside source obtained and reviewed including triage RN note, last PCP visit from 01/05/2023   Lab Tests:  I Ordered, and personally interpreted labs.  The pertinent results include:   CBG 119    Medicines ordered and prescription drug management:  I ordered medication including ibuprofen , metformin  for headache, medication refill Reevaluation of the patient after these medicines showed that the patient improved I have reviewed the patients home medicines and have made adjustments as needed     Problem List / ED Course:  HA Neurologically intact with no visual disturbances.  No focal deficits.  Cranial nerves intact. Mildly hypertensive  at 140/100.  No elevated creatinine.  Low suspicion for hypertensive crisis, emergency Headache came on gradually and is not intractable.  No trauma.  Low suspicion for ICH, SAH Encounter for medication refill Was on same dose of metformin  for several years.  Stopped as he lost insurance and has financial instability.  Will provide short 30-day course and have him follow-up with PCP   Reevaluation:  After the interventions noted above, I reevaluated the patient and found that they have :improved    Dispostion:  After consideration of the diagnostic results and the patients response to treatment, I feel that the patent would benefit from outpatient management PCP follow-up.   Discussed ED workup, disposition, return to ED precautions with patient who expresses understanding agrees with plan.  All questions answered to their satisfaction.  They are agreeable to plan.  Discharge instructions provided on paperwork Final Clinical Impression(s) / ED Diagnoses Final diagnoses:  Acute non intractable tension-type headache  Encounter for medication refill    Rx / DC Orders ED Discharge Orders          Ordered    metFORMIN  (GLUCOPHAGE -XR) 500 MG 24 hr tablet  2 times daily with meals        10/29/23 2357              Royann Cords, PA 10/30/23 0005    Afton Horse T, DO 11/03/23 1549

## 2023-10-29 NOTE — ED Provider Notes (Incomplete)
 [place] EMERGENCY DEPARTMENT AT [facility] Provider Note   CSN: [id number] Arrival date & time: 10/29/23  2047     History {Add pertinent medical, surgical, social history, OB history to HPI:1} Chief Complaint  Patient presents with  . Blood Sugar Problem    Austin Mcmahon is a 43 y.o. male with past medical history of T2DM, HTN, OSA presents to emergency department for evaluation of metformin  refill, and headache.  He reports that headache gradually started at 1515 in a bandlike pattern around his head.  He denies blurred vision, chest pain, shortness of breath, pedal edema.  Of note, he was last seen by PCP on 01/05/2023 however has not had access to his medications for "a long time" as he lost his insurance that was provided through job and has had financial instability  HPI     Home Medications Prior to Admission medications   Medication Sig Start Date End Date Taking? Authorizing Provider  atorvastatin  (LIPITOR) 40 MG tablet Take 1 tablet (40 mg total) by mouth daily. 04/07/22   Tonna Frederic, MD  chlorthalidone  (HYGROTON ) 25 MG tablet Take 1 tablet (25 mg total) by mouth daily. 01/05/23   Tonna Frederic, MD  clindamycin (CLEOCIN T) 1 % external solution Apply 1 Application topically 2 (two) times daily. 01/01/23   [provider]  clobetasol cream (TEMOVATE) 0.05 % Apply 1 Application topically 2 (two) times daily. 01/01/23   [provider]  metFORMIN  (GLUCOPHAGE -XR) 500 MG 24 hr tablet TAKE 1 TABLET BY MOUTH 2 TIMES DAILY WITH A MEAL. 03/13/23   Tonna Frederic, MD  olmesartan  (BENICAR ) 40 MG tablet Take 1 tablet (40 mg total) by mouth daily. 10/05/22   Tonna Frederic, MD  Semaglutide ,0.25 or 0.5MG /DOS, (OZEMPIC , 0.25 OR 0.5 MG/DOSE,) 2 MG/3ML SOPN Inject 0.5 mg into the skin once a week. 01/05/23   Tonna Frederic, MD      Allergies    Patient has no known allergies.    Review of Systems   Review of  Systems  Constitutional:  Negative for chills, fatigue and fever.  Respiratory:  Negative for cough, chest tightness, shortness of breath and wheezing.   Cardiovascular:  Negative for chest pain and palpitations.  Gastrointestinal:  Negative for abdominal pain, constipation, diarrhea, nausea and vomiting.  Neurological:  Negative for dizziness, seizures, weakness, light-headedness, numbness and headaches.    Physical Exam Updated Vital Signs BP (!) 140/100 (BP Location: Right Arm)   Pulse 76   Temp 98 F (36.7 C)   Resp 20   SpO2 99%  Physical Exam Vitals and nursing note reviewed.  Constitutional:      General: He is not in acute distress.    Appearance: Normal appearance. He is obese.  HENT:     Head: Normocephalic and atraumatic.  Eyes:     General: Lids are normal. Vision grossly intact. No visual field deficit.    Extraocular Movements:     Right eye: Normal extraocular motion and no nystagmus.     Left eye: Normal extraocular motion and no nystagmus.     Conjunctiva/sclera: Conjunctivae normal.  Cardiovascular:     Rate and Rhythm: Normal rate.     Pulses: Normal pulses.     Heart sounds: Normal heart sounds.  Pulmonary:     Effort: Pulmonary effort is normal. No respiratory distress.     Breath sounds: Normal breath sounds.  Chest:     Chest wall: No tenderness.  Musculoskeletal:  Right lower leg: No edema.     Left lower leg: No edema.     Comments: No swelling or tenderness to BLE.  Celine Collard' sign negative x 2  Skin:    Coloration: Skin is not jaundiced or pale.  Neurological:     Mental Status: He is alert and oriented to person, place, and time. Mental status is at baseline.     GCS: GCS eye subscore is 4. GCS verbal subscore is 5. GCS motor subscore is 6.     Cranial Nerves: No cranial nerve deficit, dysarthria or facial asymmetry.     Sensory: No sensory deficit.     Motor: No weakness, tremor, abnormal muscle tone, seizure activity or pronator drift.      Coordination: Coordination normal. Finger-Nose-Finger Test and Heel to [facility] Test normal.     Gait: Gait normal.     Deep Tendon Reflexes: Reflexes normal.     Comments: Following commands appropriately.  Ambulates without difficulty.     ED Results / Procedures / Treatments   Labs (all labs ordered are listed, but only abnormal results are displayed) Labs Reviewed  CBC WITH DIFFERENTIAL/PLATELET - Abnormal; Notable for the following components:      Result Value   MCHC 37.0 (*)    All other components within normal limits  BASIC METABOLIC PANEL WITH GFR - Abnormal; Notable for the following components:   Glucose, Bld 118 (*)    All other components within normal limits  CBG MONITORING, ED - Abnormal; Notable for the following components:   Glucose-Capillary 102 (*)    All other components within normal limits    EKG None  Radiology No results found.  Procedures Procedures  {Document cardiac monitor, telemetry assessment procedure when appropriate:1}  Medications Ordered in ED Medications - No data to display  ED Course/ Medical Decision Making/ A&P   {   Click here for ABCD2, HEART and other calculatorsREFRESH Note before signing :1}                              Medical Decision Making Amount and/or Complexity of Data Reviewed Labs: ordered.  Risk Prescription drug management.   Patient presents to the ED for concern of ***, this involves an extensive number of treatment options, and is a complaint that carries with it a high risk of complications and morbidity.  The differential diagnosis includes ***   Co morbidities that complicate the patient evaluation  ***   Additional history obtained:  Additional history obtained from *** {Blank multiple:19196::"EMS","Family","Nursing","Outside Medical Records","Past Admission"}   External records from outside source obtained and reviewed including ***   Lab Tests:  I Ordered, and personally interpreted  labs.  The pertinent results include:  ***   Imaging Studies ordered:  I ordered imaging studies including ***  I independently visualized and interpreted imaging which showed *** I agree with the radiologist interpretation   Cardiac Monitoring:  The patient was maintained on a cardiac monitor.  I personally viewed and interpreted the cardiac monitored which showed an underlying rhythm of: ***   Medicines ordered and prescription drug management:  I ordered medication including ***  for ***  Reevaluation of the patient after these medicines showed that the patient {resolved/improved/worsened:23923::"improved"} I have reviewed the patients home medicines and have made adjustments as needed   Test Considered:  ***   Critical Interventions:  ***   Consultations Obtained:  I requested consultation with  the ***,  and discussed lab and imaging findings as well as pertinent plan - they recommend: ***   Problem List / ED Course:  ***   Reevaluation:  After the interventions noted above, I reevaluated the patient and found that they have :{resolved/improved/worsened:23923::"improved"}   Social Determinants of Health:  ***   Dispostion:  After consideration of the diagnostic results and the patients response to treatment, I feel that the patent would benefit from ***.    {Document critical care time when appropriate:1} {Document review of labs and clinical decision tools ie heart score, Chads2Vasc2 etc:1}  {Document your independent review of radiology images, and any outside records:1} {Document your discussion with family members, caretakers, and with consultants:1} {Document social determinants of health affecting pt's care:1} {Document your decision making why or why not admission, treatments were needed:1} Final Clinical Impression(s) / ED Diagnoses Final diagnoses:  None    Rx / DC Orders ED Discharge Orders     None

## 2023-10-29 NOTE — ED Triage Notes (Signed)
 Pt state that he has been out of his Metformin  x 2 weeks and has been feeling tired and with headache x 1 day. CBG during triage is 102.

## 2023-10-30 NOTE — Discharge Instructions (Signed)
 Thank for letting us  evaluate you today.  I have sent a metformin  refill to your pharmacy.  Please try to follow-up with your PCP for further management of blood pressure, additional refills of medications  Return to emergency department if you experience blurred vision w/ headache, numbness or weakness in one-sided body, chest pain, shortness of breath

## 2023-10-31 ENCOUNTER — Telehealth: Payer: Self-pay

## 2023-10-31 NOTE — Transitions of Care (Post Inpatient/ED Visit) (Signed)
   10/31/2023  Name: DEANNA BOEHLKE MRN: [id number] DOB: 1980-10-15  Today's TOC FU Call Status: Today's TOC FU Call Status:: Unsuccessful Call (1st Attempt) Unsuccessful Call (1st Attempt) Date: 10/31/23  Attempted to reach the patient regarding the most recent Inpatient/ED visit.  Follow Up Plan: Additional outreach attempts will be made to reach the patient to complete the Transitions of Care (Post Inpatient/ED visit) call.   Signature  Kirby Peoples, RMA

## 2023-12-20 ENCOUNTER — Emergency Department (HOSPITAL_COMMUNITY)
Admission: EM | Admit: 2023-12-20 | Discharge: 2023-12-20 | Disposition: A | Discharge status: Home / Self Care | Payer: Self-pay | Attending: Emergency Medicine | Admitting: Emergency Medicine

## 2023-12-20 ENCOUNTER — Other Ambulatory Visit: Payer: Self-pay

## 2023-12-20 DIAGNOSIS — I10 Essential (primary) hypertension: Secondary | ICD-10-CM | POA: Insufficient documentation

## 2023-12-20 DIAGNOSIS — E119 Type 2 diabetes mellitus without complications: Secondary | ICD-10-CM | POA: Insufficient documentation

## 2023-12-20 DIAGNOSIS — Z79899 Other long term (current) drug therapy: Secondary | ICD-10-CM | POA: Insufficient documentation

## 2023-12-20 DIAGNOSIS — Z7984 Long term (current) use of oral hypoglycemic drugs: Secondary | ICD-10-CM | POA: Insufficient documentation

## 2023-12-20 DIAGNOSIS — K0889 Other specified disorders of teeth and supporting structures: Secondary | ICD-10-CM | POA: Insufficient documentation

## 2023-12-20 MED ORDER — AMOXICILLIN-POT CLAVULANATE 875-125 MG PO TABS: 1.0000 | ORAL_TABLET | Freq: Two times a day (BID) | ORAL | 0 refills | Status: AC

## 2023-12-20 NOTE — Discharge Instructions (Addendum)
 It was a pleasure taking part in your care.  As discussed, please follow-up with your dentist next week.  Please begin taking ibuprofen  600 mg, Tylenol 650 mg combined for the next 2 days for pain relief.  Please do this every 6 hours.  You may return to the ED with any new or worsening symptoms.  Please been taking Augmentin twice a day for 7 days.

## 2023-12-20 NOTE — ED Provider Notes (Signed)
 [place] [facility] DEPARTMENT AT [facility] Provider Note   CSN: [id number] Arrival date & time: 12/20/23  0006     History  Chief Complaint  Patient presents with   Dental Pain    Austin Mcmahon is a 43 y.o. male with medical history of diabetes, hypertension.  Patient presents to ED for evaluation of dental pain.  Reports that 1 week ago he broke off his second molar while eating a peach doughnut.  States that he has had progressively worsening pain since this event.  Reports that he is set to see his dentist next week for repair.  Reports that he did see dentist 6 months ago and goes to the dentist every 6 months for cleanings.  Denies any fevers nausea or vomiting at home.  Reports has been taking ibuprofen  with mild relief.   Dental Pain      Home Medications Prior to Admission medications   Medication Sig Start Date End Date Taking? Authorizing Provider  amoxicillin -clavulanate (AUGMENTIN) 875-125 MG tablet Take 1 tablet by mouth every 12 (twelve) hours. 12/20/23  Yes Adel Aden, PA-C  atorvastatin  (LIPITOR) 40 MG tablet Take 1 tablet (40 mg total) by mouth daily. 04/07/22   Tonna Frederic, MD  chlorthalidone  (HYGROTON ) 25 MG tablet Take 1 tablet (25 mg total) by mouth daily. 01/05/23   Tonna Frederic, MD  clindamycin (CLEOCIN T) 1 % external solution Apply 1 Application topically 2 (two) times daily. 01/01/23   [provider]  clobetasol cream (TEMOVATE) 0.05 % Apply 1 Application topically 2 (two) times daily. 01/01/23   [provider]  metFORMIN  (GLUCOPHAGE -XR) 500 MG 24 hr tablet TAKE 1 TABLET BY MOUTH 2 TIMES DAILY WITH A MEAL. 03/13/23   Tonna Frederic, MD  metFORMIN  (GLUCOPHAGE -XR) 500 MG 24 hr tablet Take 1 tablet (500 mg total) by mouth 2 (two) times daily with a meal. 10/29/23   Royann Cords, PA  olmesartan  (BENICAR ) 40 MG tablet Take 1 tablet (40 mg total) by mouth daily. 10/05/22   Tonna Frederic, MD  Semaglutide ,0.25 or 0.5MG /DOS, (OZEMPIC , 0.25 OR 0.5 MG/DOSE,) 2 MG/3ML SOPN Inject 0.5 mg into the skin once a week. 01/05/23   Tonna Frederic, MD      Allergies    Patient has no known allergies.    Review of Systems   Review of Systems  HENT:  Positive for dental problem.   All other systems reviewed and are negative.   Physical Exam Updated Vital Signs BP (!) 166/108   Pulse 94   Temp 98.5 F (36.9 C)   Resp 19   Ht 5' 7 (1.702 m)   Wt 117 kg   SpO2 99%   BMI 40.41 kg/m  Physical Exam Vitals and nursing note reviewed.  Constitutional:      General: He is not in acute distress.    Appearance: He is well-developed.  HENT:     Head: Normocephalic and atraumatic.     Mouth/Throat:     Comments: Broken off second molar of right lower portion of teeth.  Uvula midline.  Handling secretions appropriately.  No drooling, no change phonation.  No signs of RPA, PTA.  No Ludwig angina. Eyes:     Conjunctiva/sclera: Conjunctivae normal.  Cardiovascular:     Rate and Rhythm: Normal rate and regular rhythm.     Heart sounds: No murmur heard. Pulmonary:     Effort: Pulmonary effort is normal. No respiratory distress.  Breath sounds: Normal breath sounds.  Abdominal:     Palpations: Abdomen is soft.     Tenderness: There is no abdominal tenderness.  Musculoskeletal:        General: No swelling.     Cervical back: Neck supple.  Skin:    General: Skin is warm and dry.     Capillary Refill: Capillary refill takes less than 2 seconds.  Neurological:     Mental Status: He is alert.  Psychiatric:        Mood and Affect: Mood normal.     ED Results / Procedures / Treatments   Labs (all labs ordered are listed, but only abnormal results are displayed) Labs Reviewed - No data to display  EKG None  Radiology No results found.  Procedures Procedures    Medications Ordered in ED Medications - No data to display  ED Course/ Medical Decision  Making/ A&P  Medical Decision Making  43 year old male presents for evaluation.  Please see HPI for further details.  On examination the patient is afebrile, nontachycardic.  His lung sounds are clear bilaterally, he is not hypoxic.  Abdomen is soft and compressible.  Neurological examination is at baseline.  Patient has broken left second molar to right lower portion of teeth.  No surrounding abscess.  Uvula midline, he is handling secretions appropriately, there is no drooling, there is no change in phonation.  Patient was placed on Augmentin which she will take twice a day for the next 7 days.  He reports he is set to see his dentist next week.  He reports that he will continue taking ibuprofen  at home for pain.  He had all of his questions answered to his satisfaction.  He is stable to discharge home.   Final Clinical Impression(s) / ED Diagnoses Final diagnoses:  Pain, dental    Rx / DC Orders ED Discharge Orders          Ordered    amoxicillin -clavulanate (AUGMENTIN) 875-125 MG tablet  Every 12 hours        12/20/23 0040              Tecla Mailloux F, PA-C 12/20/23 0040    Alissa April, MD 12/20/23 [phone]

## 2024-05-31 ENCOUNTER — Other Ambulatory Visit: Payer: Self-pay | Admitting: Family Medicine

## 2024-05-31 DIAGNOSIS — E1165 Type 2 diabetes mellitus with hyperglycemia: Secondary | ICD-10-CM

## 2024-08-15 ENCOUNTER — Encounter: Payer: Self-pay | Admitting: Family Medicine
